# Patient Record
Sex: Female | Born: 1980 | Race: White | Hispanic: No | Marital: Married | Smoking: Former smoker
Health system: Southern US, Community
[De-identification: ages and names within clinical notes are randomized; demographics above are authoritative.]

## PROBLEM LIST (undated history)

## (undated) DIAGNOSIS — K635 Polyp of colon: Secondary | ICD-10-CM

## (undated) DIAGNOSIS — B001 Herpesviral vesicular dermatitis: Secondary | ICD-10-CM

## (undated) DIAGNOSIS — K644 Residual hemorrhoidal skin tags: Secondary | ICD-10-CM

## (undated) DIAGNOSIS — N390 Urinary tract infection, site not specified: Secondary | ICD-10-CM

## (undated) DIAGNOSIS — T7840XA Allergy, unspecified, initial encounter: Secondary | ICD-10-CM

## (undated) DIAGNOSIS — Z789 Other specified health status: Secondary | ICD-10-CM

## (undated) DIAGNOSIS — F4010 Social phobia, unspecified: Secondary | ICD-10-CM

## (undated) DIAGNOSIS — J45909 Unspecified asthma, uncomplicated: Secondary | ICD-10-CM

## (undated) DIAGNOSIS — K602 Anal fissure, unspecified: Secondary | ICD-10-CM

## (undated) DIAGNOSIS — F900 Attention-deficit hyperactivity disorder, predominantly inattentive type: Secondary | ICD-10-CM

## (undated) DIAGNOSIS — U071 COVID-19: Secondary | ICD-10-CM

## (undated) DIAGNOSIS — R002 Palpitations: Secondary | ICD-10-CM

## (undated) HISTORY — PX: CYSTOSCOPY: SUR368

## (undated) HISTORY — DX: Allergy, unspecified, initial encounter: T78.40XA

## (undated) HISTORY — DX: Unspecified asthma, uncomplicated: J45.909

## (undated) HISTORY — DX: Herpesviral vesicular dermatitis: B00.1

## (undated) HISTORY — DX: Polyp of colon: K63.5

## (undated) HISTORY — PX: WISDOM TOOTH EXTRACTION: SHX21

## (undated) HISTORY — DX: Palpitations: R00.2

## (undated) HISTORY — DX: COVID-19: U07.1

## (undated) HISTORY — DX: Anal fissure, unspecified: K60.2

## (undated) HISTORY — DX: Residual hemorrhoidal skin tags: K64.4

## (undated) HISTORY — PX: COLONOSCOPY: SHX174

---
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
## 2012-10-06 ENCOUNTER — Encounter (HOSPITAL_COMMUNITY): Payer: Self-pay | Admitting: *Deleted

## 2012-10-06 ENCOUNTER — Emergency Department (INDEPENDENT_AMBULATORY_CARE_PROVIDER_SITE_OTHER)
Admission: EM | Admit: 2012-10-06 | Discharge: 2012-10-06 | Disposition: A | Discharge status: Home / Self Care | Payer: Self-pay | Source: Home / Self Care

## 2012-10-06 DIAGNOSIS — N390 Urinary tract infection, site not specified: Secondary | ICD-10-CM

## 2012-10-06 LAB — POCT URINALYSIS DIP (DEVICE)
Bilirubin Urine: NEGATIVE
Glucose, UA: 100 mg/dL — AB
Ketones, ur: NEGATIVE mg/dL
Specific Gravity, Urine: <=1.005 (ref 1.005–1.030)

## 2012-10-06 MED ORDER — CEPHALEXIN 500 MG PO CAPS: 500.0000 mg | ORAL_CAPSULE | Freq: Two times a day (BID) | ORAL | Status: DC

## 2012-10-06 NOTE — ED Provider Notes (Signed)
Bobbiejo Ishikawa is a 32 y.o. female who presents to Urgent Care today for dysuria. Patient has noted dysuria or cloudy urine urinary frequency and foul-smelling urine since yesterday. She was traveling yesterday and took AZO which has helped.  She is a history of frequent UTIs most recently October.  In 2005 she was seen by urologist for frequent UTIs where a cystoscopy was performed to white in her urethra.  She has never had a urinary tract infection with resistant organisms.  She feels well otherwise no fevers chills abdominal or back pain.  Her current symptoms are consistent with prior UTIs.  Her most recent menstrual period was December 30th. She is attempting to become pregnant.   PMH reviewed. Frequent UTI History  Substance Use Topics  . Smoking status: Never Smoker   . Smokeless tobacco: Not on file  . Alcohol Use: Yes     Comment: occasionally   ROS as above Medications reviewed. No current facility-administered medications for this encounter.   Current Outpatient Prescriptions  Medication Sig Dispense Refill  . cephALEXin (KEFLEX) 500 MG capsule Take 1 capsule (500 mg total) by mouth 2 (two) times daily.  14 capsule  0    Exam:  BP 134/80  Pulse 57  Temp 98.8 F (37.1 C) (Oral)  Resp 16  SpO2 100%  LMP 09/30/2012 Gen: Well NAD Lungs: CTABL Nl WOB Heart: RRR no MRG Abd: NABS, NT, ND Exts: Non edematous BL  LE, warm and well perfused.   Results for orders placed during the hospital encounter of 10/06/12 (from the past 24 hour(s))  POCT URINALYSIS DIP (DEVICE)     Status: Abnormal   Collection Time   10/06/12 12:58 PM      Component Value Range   Glucose, UA 100 (*) NEGATIVE mg/dL   Bilirubin Urine NEGATIVE  NEGATIVE   Ketones, ur NEGATIVE  NEGATIVE mg/dL   Specific Gravity, Urine <=1.005  1.005 - 1.030   Hgb urine dipstick NEGATIVE  NEGATIVE   pH 5.5  5.0 - 8.0   Protein, ur 30 (*) NEGATIVE mg/dL   Urobilinogen, UA 1.0  0.0 - 1.0 mg/dL   Nitrite POSITIVE (*)  NEGATIVE   Leukocytes, UA LARGE (*) NEGATIVE   No results found.  Assessment and Plan: 32 y.o. female with UTI.  Plan to treat with Keflex.  Discussed warning signs or symptoms. Please see discharge instructions. Patient expresses understanding. F/u PRN     Rodolph Bong, MD 10/06/12 1304

## 2012-10-06 NOTE — ED Provider Notes (Signed)
Medical screening examination/treatment/procedure(s) were performed by  [facility] fellow and as supervising physician I was immediately available for consultation/collaboration.   Sharin Grave, MD   Sharin Grave, MD 10/06/12 [phone]

## 2012-10-06 NOTE — ED Notes (Signed)
Patient complains of burning sensation while voiding x 1 day. Patient states strong odor from urine. Denies nausea, vomiting, diarrhea, fever/chills.

## 2012-12-03 ENCOUNTER — Encounter (HOSPITAL_COMMUNITY): Payer: Self-pay | Admitting: Pharmacist

## 2012-12-03 ENCOUNTER — Encounter (HOSPITAL_COMMUNITY): Payer: Self-pay | Admitting: *Deleted

## 2012-12-03 NOTE — H&P (Signed)
32 yo G1 @ [redacted] wks gestation by LMP presents for surgical mngt of missed Ab   PMHx:  UTI PSHx:  Colonoscopy, cystocopy All:  macrobid Meds:  PNV FHx:  N/c SHx:  Negative tobacco  Af, VSS Gen - NAD ABd - soft, NT Ext - NT, no edema CV - RRR Lungs - clear  [place] - gestational sac c/w [redacted] wk gestation, no fetal pole or YS.  No change in [place] over 2 wks  A/P:  Missed Ab D&E

## 2012-12-05 MED ORDER — DEXTROSE 5 % IV SOLN
2.0000 g | INTRAVENOUS | Status: AC
  Filled 2012-12-05: qty 2

## 2012-12-06 ENCOUNTER — Ambulatory Visit (HOSPITAL_COMMUNITY)
Admission: RE | Admit: 2012-12-06 | Payer: Commercial Managed Care - PPO | Source: Ambulatory Visit | Admitting: Obstetrics and Gynecology

## 2012-12-06 HISTORY — DX: Urinary tract infection, site not specified: N39.0

## 2012-12-06 HISTORY — DX: Other specified health status: Z78.9

## 2012-12-06 SURGERY — DILATION AND EVACUATION, UTERUS
Anesthesia: Choice

## 2013-03-01 LAB — OB RESULTS CONSOLE GC/CHLAMYDIA
CHLAMYDIA, DNA PROBE: NEGATIVE
Gonorrhea: NEGATIVE

## 2013-04-08 LAB — OB RESULTS CONSOLE ABO/RH: RH Type: POSITIVE

## 2013-04-08 LAB — OB RESULTS CONSOLE HEPATITIS B SURFACE ANTIGEN: HEP B S AG: NEGATIVE

## 2013-04-08 LAB — OB RESULTS CONSOLE RUBELLA ANTIBODY, IGM: RUBELLA: IMMUNE

## 2013-04-08 LAB — OB RESULTS CONSOLE ANTIBODY SCREEN: ANTIBODY SCREEN: NEGATIVE

## 2013-04-08 LAB — OB RESULTS CONSOLE RPR: RPR: NONREACTIVE

## 2013-04-08 LAB — OB RESULTS CONSOLE HIV ANTIBODY (ROUTINE TESTING): HIV: NONREACTIVE

## 2013-05-27 ENCOUNTER — Encounter: Payer: Self-pay | Admitting: Internal Medicine

## 2013-05-27 ENCOUNTER — Ambulatory Visit (INDEPENDENT_AMBULATORY_CARE_PROVIDER_SITE_OTHER): Payer: Commercial Managed Care - PPO | Admitting: Internal Medicine

## 2013-05-27 VITALS — BP 100/52 | Ht 62.0 in | Wt 156.7 lb

## 2013-05-27 DIAGNOSIS — Z3490 Encounter for supervision of normal pregnancy, unspecified, unspecified trimester: Secondary | ICD-10-CM | POA: Insufficient documentation

## 2013-05-27 DIAGNOSIS — R002 Palpitations: Secondary | ICD-10-CM

## 2013-05-27 DIAGNOSIS — Z331 Pregnant state, incidental: Secondary | ICD-10-CM

## 2013-05-27 NOTE — Patient Instructions (Addendum)
Your physician recommends that you schedule a follow-up appointment as needed  

## 2013-05-27 NOTE — Progress Notes (Signed)
OFFICE NOTE  Chief Complaint:  Palpitations  Primary Care Physician: No PCP Per Patient  HPI:  Judy Shaw is a pleasant 32 year old female who is now [redacted] weeks pregnant. She has a history of palpitations dating back to age 20 and a family history of palpitations. She was also told that this is fairly normal and recently reported to her husband that she had some palpitations. He said to her that he does not have any palpitations and felt that it was clearly abnormal for her. Therefore she brought it up to her OB/GYN, who recommended evaluation in our office. She reports a palpitations, at random times. She does not feel like her heart races but just simply skips beats. In fact her symptoms were pretty bad initially the pregnancy, but have progressively gotten somewhat better. Now she reports that she is fairly unaware of them. She denies any chest pain, worsening shortness of breath, presyncope or syncopal episodes.  PMHx:  Past Medical History  Diagnosis Date  . Medical history non-contributory   . UTI (urinary tract infection)     history - 7 last year in 2013 per patient    Past Surgical History  Procedure Laterality Date  . Colonoscopy    . Cystoscopy    . Wisdom tooth extraction      FAMHx:  Family History  Problem Relation Age of Onset  . Heart Problems Father     palpitations  . Heart Problems Brother     palpitations  . Hypertension      SOCHx:   reports that she quit smoking about 9 years ago. She has never used smokeless tobacco. She reports that she does not drink alcohol or use illicit drugs.  ALLERGIES:  Allergies  Allergen Reactions  . Macrobid [Nitrofurantoin Macrocrystal] Anaphylaxis    ROS: A comprehensive review of systems was negative except for: Cardiovascular: positive for palpitations  HOME MEDS: Current Outpatient Prescriptions  Medication Sig Dispense Refill  . folic acid (FOLVITE) 400 MCG tablet Take 400 mcg by mouth daily.      .  Prenatal Vit-Fe Fumarate-FA (MULTIVITAMIN-PRENATAL) 27-0.8 MG TABS Take 1 tablet by mouth daily at 12 noon.       No current facility-administered medications for this visit.    LABS/IMAGING: No results found for this or any previous visit (from the past 48 hour(s)). No results found.  VITALS: BP 100/52  Ht 5\' 2"  (1.575 m)  Wt 156 lb 11.2 oz (71.079 kg)  BMI 28.65 kg/m2  LMP 09/30/2012  EXAM: General appearance: alert and no distress Neck: no adenopathy, no carotid bruit, no JVD, supple, symmetrical, trachea midline and thyroid not enlarged, symmetric, no tenderness/mass/nodules Lungs: clear to auscultation bilaterally Heart: regular rate and rhythm, S1, S2 normal, no murmur, click, rub or gallop Abdomen: gravid uterus Extremities: extremities normal, atraumatic, no cyanosis or edema Pulses: 2+ and symmetric Skin: Skin color, texture, turgor normal. No rashes or lesions Neurologic: Grossly normal  EKG: Normal sinus rhythm at 57  ASSESSMENT: 1. Benign palpitations 2. Pregnancy  PLAN: 1.   Mrs. Knies has had palpitations for years which she reported were slightly worse after conception, but have waned somewhat in the past few weeks. She denies any high-risk features such as syncope, worsening shortness of breath, chest pain and has no other cardiac risk factors. This is her first pregnancy. She seems to be tolerating the palpitations better anemia decreased in frequency. There're no evidence of extrasystoles on her EKG today. There is no evidence  for accessory bypass tract or preexcitation on her EKG. She has no appreciable heart murmur. We discussed options today including possibly wearing a monitor. Although her symptoms are improving, therefore I would not recommend one at this time. If the symptoms worsen we could consider wearing a monitor to identify the cause of her palpitations. Generally at like to try to avoid beta blockers if possible during pregnancy and I shared this  with her. Finally I asked her to work on trying to eliminate caffeine from her diet if possible which may be very helpful for her. I would be happy to see her back as needed.  Thanks for the kind referral. If I can be further assistance with her other patients, please don't hesitate to contact me.  Chrystie Nose, MD, [facility] Attending Cardiologist [facility]  Francisca Langenderfer C 05/27/2013, 4:53 PM

## 2013-07-15 ENCOUNTER — Ambulatory Visit: Payer: Commercial Managed Care - PPO | Admitting: Cardiology

## 2013-10-02 NOTE — L&D Delivery Note (Signed)
Operative Delivery Note At 1:26 PM a viable female was delivered via .  Presentation: vertex; Position: Right,, Occiput,, Anterior; Station: +3.  Verbal consent: obtained from patient.  Risks and benefits discussed in detail.  Risks include, but are not limited to the risks of anesthesia, bleeding, infection, damage to maternal tissues, fetal cephalhematoma.  There is also the risk of inability to effect vaginal delivery of the head, or shoulder dystocia that cannot be resolved by established maneuvers, leading to the need for emergency cesarean section.  APGAR: pending   Placenta status manually removed intact  , .   Cord:  with the following complications: long and nuchal cord x 1.  Cord pH: not done  Anesthesia: Spinal  Instruments: Mushroom 4 pulls with 2 popoffs Episiotomy: mediolateral  Lacerations: none Suture Repair: 3.0 chromic Est. Blood Loss (mL): 300  Mom to postpartum.  Baby to Couplet care / Skin to Skin.  Chanteria Haggard L 10/31/2013, 1:44 PM

## 2013-10-06 LAB — OB RESULTS CONSOLE GBS: GBS: NEGATIVE

## 2013-10-31 ENCOUNTER — Inpatient Hospital Stay (HOSPITAL_COMMUNITY)
Admission: AD | Admit: 2013-10-31 | Discharge: 2013-11-02 | DRG: 774 | Disposition: A | Discharge status: Home / Self Care | Payer: Commercial Managed Care - PPO | Source: Ambulatory Visit | Attending: Obstetrics and Gynecology | Admitting: Obstetrics and Gynecology

## 2013-10-31 ENCOUNTER — Encounter (HOSPITAL_COMMUNITY): Payer: Self-pay

## 2013-10-31 ENCOUNTER — Inpatient Hospital Stay (HOSPITAL_COMMUNITY): Payer: Commercial Managed Care - PPO | Admitting: Anesthesiology

## 2013-10-31 ENCOUNTER — Encounter (HOSPITAL_COMMUNITY): Payer: Commercial Managed Care - PPO | Admitting: Anesthesiology

## 2013-10-31 DIAGNOSIS — O4290 Premature rupture of membranes, unspecified as to length of time between rupture and onset of labor, unspecified weeks of gestation: Principal | ICD-10-CM | POA: Diagnosis present

## 2013-10-31 DIAGNOSIS — Z3490 Encounter for supervision of normal pregnancy, unspecified, unspecified trimester: Secondary | ICD-10-CM

## 2013-10-31 DIAGNOSIS — IMO0001 Reserved for inherently not codable concepts without codable children: Secondary | ICD-10-CM

## 2013-10-31 DIAGNOSIS — R002 Palpitations: Secondary | ICD-10-CM

## 2013-10-31 LAB — CBC
HCT: 37.8 % (ref 36.0–46.0)
HEMOGLOBIN: 13.1 g/dL (ref 12.0–15.0)
MCH: 32.6 pg (ref 26.0–34.0)
MCHC: 34.7 g/dL (ref 30.0–36.0)
MCV: 94 fL (ref 78.0–100.0)
Platelets: 157 10*3/uL (ref 150–400)
RBC: 4.02 MIL/uL (ref 3.87–5.11)
RDW: 12.8 % (ref 11.5–15.5)
WBC: 12 10*3/uL — ABNORMAL HIGH (ref 4.0–10.5)

## 2013-10-31 LAB — RPR: RPR Ser Ql: NONREACTIVE

## 2013-10-31 LAB — POCT FERN TEST: POCT Fern Test: POSITIVE

## 2013-10-31 MED ORDER — LACTATED RINGERS IV SOLN
500.0000 mL | INTRAVENOUS | Status: DC | PRN
  Administered 2013-10-31: 1000 mL via INTRAVENOUS
  Administered 2013-10-31: 500 mL via INTRAVENOUS

## 2013-10-31 MED ORDER — PRENATAL MULTIVITAMIN CH
1.0000 | ORAL_TABLET | Freq: Every day | ORAL | Status: DC
  Administered 2013-11-01 – 2013-11-02 (×2): 1 via ORAL
  Filled 2013-10-31 (×2): qty 1

## 2013-10-31 MED ORDER — MEDROXYPROGESTERONE ACETATE 150 MG/ML IM SUSP: 150.0000 mg | INTRAMUSCULAR | Status: DC | PRN

## 2013-10-31 MED ORDER — SODIUM BICARBONATE 8.4 % IV SOLN
INTRAVENOUS | Status: DC | PRN
  Administered 2013-10-31: 5 mL via EPIDURAL

## 2013-10-31 MED ORDER — LACTATED RINGERS IV SOLN
INTRAVENOUS | Status: DC
Start: 2013-10-31 — End: 2013-10-31
  Administered 2013-10-31 (×2): via INTRAVENOUS

## 2013-10-31 MED ORDER — PHENYLEPHRINE 40 MCG/ML (10ML) SYRINGE FOR IV PUSH (FOR BLOOD PRESSURE SUPPORT)
80.0000 ug | PREFILLED_SYRINGE | INTRAVENOUS | Status: DC | PRN
  Filled 2013-10-31: qty 2

## 2013-10-31 MED ORDER — MEASLES, MUMPS & RUBELLA VAC ~~LOC~~ INJ: 0.5000 mL | INJECTION | Freq: Once | SUBCUTANEOUS | Status: DC

## 2013-10-31 MED ORDER — ZOLPIDEM TARTRATE 5 MG PO TABS: 5.0000 mg | ORAL_TABLET | Freq: Every evening | ORAL | Status: DC | PRN

## 2013-10-31 MED ORDER — EPHEDRINE 5 MG/ML INJ
10.0000 mg | INTRAVENOUS | Status: DC | PRN
  Filled 2013-10-31: qty 4
  Filled 2013-10-31: qty 2

## 2013-10-31 MED ORDER — IBUPROFEN 600 MG PO TABS: 600.0000 mg | ORAL_TABLET | Freq: Four times a day (QID) | ORAL | Status: DC | PRN

## 2013-10-31 MED ORDER — DIPHENHYDRAMINE HCL 25 MG PO CAPS: 25.0000 mg | ORAL_CAPSULE | Freq: Four times a day (QID) | ORAL | Status: DC | PRN

## 2013-10-31 MED ORDER — CITRIC ACID-SODIUM CITRATE 334-500 MG/5ML PO SOLN: 30.0000 mL | ORAL | Status: DC | PRN

## 2013-10-31 MED ORDER — BENZOCAINE-MENTHOL 20-0.5 % EX AERO
1.0000 "application " | INHALATION_SPRAY | CUTANEOUS | Status: DC | PRN
  Administered 2013-10-31: 1 via TOPICAL
  Filled 2013-10-31: qty 56

## 2013-10-31 MED ORDER — OXYTOCIN 40 UNITS IN LACTATED RINGERS INFUSION - SIMPLE MED
62.5000 mL/h | INTRAVENOUS | Status: DC
  Filled 2013-10-31: qty 1000

## 2013-10-31 MED ORDER — IBUPROFEN 600 MG PO TABS
600.0000 mg | ORAL_TABLET | Freq: Four times a day (QID) | ORAL | Status: DC
  Administered 2013-10-31 – 2013-11-02 (×8): 600 mg via ORAL
  Filled 2013-10-31 (×8): qty 1

## 2013-10-31 MED ORDER — FLEET ENEMA 7-19 GM/118ML RE ENEM
1.0000 | ENEMA | Freq: Every day | RECTAL | Status: DC | PRN
Start: 2013-10-31 — End: 2013-11-02

## 2013-10-31 MED ORDER — ONDANSETRON HCL 4 MG/2ML IJ SOLN: 4.0000 mg | Freq: Four times a day (QID) | INTRAMUSCULAR | Status: DC | PRN

## 2013-10-31 MED ORDER — SENNOSIDES-DOCUSATE SODIUM 8.6-50 MG PO TABS
2.0000 | ORAL_TABLET | ORAL | Status: DC
  Administered 2013-10-31 – 2013-11-01 (×2): 2 via ORAL
  Filled 2013-10-31 (×2): qty 2

## 2013-10-31 MED ORDER — OXYCODONE-ACETAMINOPHEN 5-325 MG PO TABS: 1.0000 | ORAL_TABLET | ORAL | Status: DC | PRN

## 2013-10-31 MED ORDER — DIBUCAINE 1 % RE OINT: 1.0000 "application " | TOPICAL_OINTMENT | RECTAL | Status: DC | PRN

## 2013-10-31 MED ORDER — EPHEDRINE 5 MG/ML INJ
10.0000 mg | INTRAVENOUS | Status: DC | PRN
  Filled 2013-10-31: qty 2

## 2013-10-31 MED ORDER — WITCH HAZEL-GLYCERIN EX PADS: 1.0000 "application " | MEDICATED_PAD | CUTANEOUS | Status: DC | PRN

## 2013-10-31 MED ORDER — ACETAMINOPHEN 325 MG PO TABS: 650.0000 mg | ORAL_TABLET | ORAL | Status: DC | PRN

## 2013-10-31 MED ORDER — LIDOCAINE HCL (PF) 1 % IJ SOLN
30.0000 mL | INTRAMUSCULAR | Status: DC | PRN
  Filled 2013-10-31: qty 30

## 2013-10-31 MED ORDER — ONDANSETRON HCL 4 MG PO TABS: 4.0000 mg | ORAL_TABLET | ORAL | Status: DC | PRN

## 2013-10-31 MED ORDER — SIMETHICONE 80 MG PO CHEW: 80.0000 mg | CHEWABLE_TABLET | ORAL | Status: DC | PRN

## 2013-10-31 MED ORDER — FLEET ENEMA 7-19 GM/118ML RE ENEM: 1.0000 | ENEMA | RECTAL | Status: DC | PRN

## 2013-10-31 MED ORDER — PHENYLEPHRINE 40 MCG/ML (10ML) SYRINGE FOR IV PUSH (FOR BLOOD PRESSURE SUPPORT)
80.0000 ug | PREFILLED_SYRINGE | INTRAVENOUS | Status: DC | PRN
  Filled 2013-10-31: qty 2
  Filled 2013-10-31: qty 10

## 2013-10-31 MED ORDER — OXYTOCIN 40 UNITS IN LACTATED RINGERS INFUSION - SIMPLE MED
1.0000 m[IU]/min | INTRAVENOUS | Status: DC
  Administered 2013-10-31: 1.333 m[IU]/min via INTRAVENOUS

## 2013-10-31 MED ORDER — DIPHENHYDRAMINE HCL 50 MG/ML IJ SOLN: 12.5000 mg | INTRAMUSCULAR | Status: DC | PRN

## 2013-10-31 MED ORDER — LACTATED RINGERS IV SOLN
500.0000 mL | Freq: Once | INTRAVENOUS | Status: AC
  Administered 2013-10-31: 500 mL via INTRAVENOUS

## 2013-10-31 MED ORDER — OXYTOCIN BOLUS FROM INFUSION
500.0000 mL | INTRAVENOUS | Status: DC
  Administered 2013-10-31: 500 mL via INTRAVENOUS

## 2013-10-31 MED ORDER — TERBUTALINE SULFATE 1 MG/ML IJ SOLN: 0.2500 mg | Freq: Once | INTRAMUSCULAR | Status: DC | PRN

## 2013-10-31 MED ORDER — LANOLIN HYDROUS EX OINT: TOPICAL_OINTMENT | CUTANEOUS | Status: DC | PRN

## 2013-10-31 MED ORDER — BISACODYL 10 MG RE SUPP: 10.0000 mg | Freq: Every day | RECTAL | Status: DC | PRN

## 2013-10-31 MED ORDER — TETANUS-DIPHTH-ACELL PERTUSSIS 5-2.5-18.5 LF-MCG/0.5 IM SUSP
0.5000 mL | Freq: Once | INTRAMUSCULAR | Status: DC
Start: 2013-11-01 — End: 2013-11-02

## 2013-10-31 MED ORDER — ONDANSETRON HCL 4 MG/2ML IJ SOLN: 4.0000 mg | INTRAMUSCULAR | Status: DC | PRN

## 2013-10-31 MED ORDER — FENTANYL 2.5 MCG/ML BUPIVACAINE 1/10 % EPIDURAL INFUSION (WH - ANES)
14.0000 mL/h | INTRAMUSCULAR | Status: DC | PRN
  Administered 2013-10-31: 14 mL/h via EPIDURAL
  Filled 2013-10-31: qty 125

## 2013-10-31 NOTE — Anesthesia Preprocedure Evaluation (Signed)

## 2013-10-31 NOTE — MAU Note (Addendum)
Pt states LOF since 0130AM that has been clear, states she has had an uncomplicated pregnancy and thinks she is having occ,. uc's Denies vaginal discharge or bleeding

## 2013-10-31 NOTE — Anesthesia Procedure Notes (Signed)

## 2013-10-31 NOTE — Progress Notes (Signed)
Awaiting Birthing suites

## 2013-10-31 NOTE — Lactation Note (Signed)
This note was copied from the chart of Judy Shaw. Lactation Consultation Note Initial visit at 3 hours of age.  Mom reports breast fed once and trying again because baby started to cue.  Baby skin to skin in cross cradle,  Baby licking and mom is able to demonstrate hand expression with visible colostrum.  Minimal assist to latch baby with wide flanged lips.  Observed greater than 10 minutes of good rhythmic suckling and few swallows heard.  [facility] LC resources given and discussed.  Baby and me booklet discussed regarding feeding frequency and output.  Mom denies pain or problems.  Discussed baby's small size (4#11oz) and encouraged mom to wake baby after 3 hours if baby is sleepy and not showing feeding cues.  Encouraged skin to skin and to call MBU RN for assist is any problems or missed feedings.  Discussed plan to spoon feed and pump as needed.    Patient Name: Judy Judy Shaw'UToday's Date: 10/31/2013 Reason for consult: Initial assessment   Maternal Data Formula Feeding for Exclusion: No Has patient been taught Hand Expression?: Yes Does the patient have breastfeeding experience prior to this delivery?: No  Feeding Feeding Type: Breast Fed Length of feed:  (observed greaster than 10 minuts)  LATCH Score/Interventions Latch: Grasps breast easily, tongue down, lips flanged, rhythmical sucking. Intervention(s): Assist with latch;Breast compression  Audible Swallowing: A few with stimulation Intervention(s): Skin to skin;Hand expression  Type of Nipple: Everted at rest and after stimulation (semi flat with compression baby latches well)  Comfort (Breast/Nipple): Soft / non-tender     Hold (Positioning): No assistance needed to correctly position infant at breast.  LATCH Score: 9  Lactation Tools Discussed/Used     Consult Status Consult Status: Follow-up Date: 11/01/13 Follow-up type: In-patient    Kiyo Heal, Arvella MerlesJana Lynn 10/31/2013, 5:02 PM

## 2013-10-31 NOTE — Progress Notes (Signed)
To room 175

## 2013-10-31 NOTE — H&P (Signed)
Judy Shaw J Shaw is a 33 y.o. female presenting for SROM about 1:30am. Now UCs getting stronger. No HA, no vision change, no epigastric pain. Maternal Medical History:  Reason for admission: Rupture of membranes.   Fetal activity: Perceived fetal activity is normal.      OB History   Grav Para Term Preterm Abortions TAB SAB Ect Mult Living   2 0 0 0 1 0 1 0 0 0      Past Medical History  Diagnosis Date  . Medical history non-contributory   . UTI (urinary tract infection)     history - 7 last year in 2013 per patient   Past Surgical History  Procedure Laterality Date  . Colonoscopy    . Cystoscopy    . Wisdom tooth extraction     Family History: family history includes Heart Problems in her brother and father; Hypertension in an other family member. Social History:  reports that she quit smoking about 10 years ago. She has never used smokeless tobacco. She reports that she does not drink alcohol or use illicit drugs.   Prenatal Transfer Tool  Maternal Diabetes: No Genetic Screening: Normal Maternal Ultrasounds/Referrals: Normal Fetal Ultrasounds or other Referrals:  None Maternal Substance Abuse:  No Significant Maternal Medications:  None Significant Maternal Lab Results:  None Other Comments:  None  Review of Systems  Eyes: Negative for blurred vision.  Gastrointestinal: Negative for abdominal pain.  Neurological: Negative for headaches.    Dilation: 1.5 (fern collected) Effacement (%): 70 Station: -3 Exam by:: D Nelson RN Blood pressure 129/73, pulse 53, temperature 98.6 F (37 C), temperature source Oral, resp. rate 20, height 5' 2.75" (1.594 m), weight 173 lb 2 oz (78.529 kg). Maternal Exam:  Uterine Assessment: Contraction strength is moderate.  Contraction frequency is regular.   Abdomen: Fetal presentation: vertex     Fetal Exam Fetal State Assessment: Category I - tracings are normal.     Physical Exam  Cardiovascular: Normal rate and regular  rhythm.   Respiratory: Effort normal.  GI: Soft. There is no tenderness.  Neurological: She has normal reflexes.  Vertex to palpation  Prenatal labs: ABO, Rh: A/Positive/-- (07/08 0000) Antibody: Negative (07/08 0000) Rubella: Immune (07/08 0000) RPR: Nonreactive (07/08 0000)  HBsAg: Negative (07/08 0000)  HIV: Non-reactive (07/08 0000)  GBS: Negative (01/05 0000)   Assessment/Plan: 33 yo G2P0 at 39 0/7 weeks with PROM. Now UCs getting stronger. Recheck cervix 1-2 hours D/W patient   [name],[name] 10/31/2013, 6:17 AM

## 2013-10-31 NOTE — Progress Notes (Signed)
Patient feels more contractions FHR Category 1 Toco UCs every 6 min Cervix 90% 2 to 3 -1 Vertex Augment with pitocin epidural

## 2013-11-01 LAB — CBC
HCT: 33.5 % — ABNORMAL LOW (ref 36.0–46.0)
HEMOGLOBIN: 11.2 g/dL — AB (ref 12.0–15.0)
MCH: 32.2 pg (ref 26.0–34.0)
MCHC: 33.4 g/dL (ref 30.0–36.0)
MCV: 96.3 fL (ref 78.0–100.0)
Platelets: 122 10*3/uL — ABNORMAL LOW (ref 150–400)
RBC: 3.48 MIL/uL — ABNORMAL LOW (ref 3.87–5.11)
RDW: 13.2 % (ref 11.5–15.5)
WBC: 13.9 10*3/uL — AB (ref 4.0–10.5)

## 2013-11-01 NOTE — Lactation Note (Signed)
This note was copied from the chart of Judy Vickii PennaJennifer Popov. Lactation Consultation Note Follow up consult:  [name] 22 hours old and sleeping on mother's chest.  Mother's nipples are pink and sore.  Provided comfort gels and reviewed care.  Mother states breastfeeding going well.  Encouraged mother to call for assistance with next feeding.    Patient Name: Judy Shaw Judy Shaw's Date: 11/01/2013 Reason for consult: Follow-up assessment   Maternal Data    Feeding Feeding Type: Breast Fed  LATCH Score/Interventions Latch: Repeated attempts needed to sustain latch, nipple held in mouth throughout feeding, stimulation needed to elicit sucking reflex.  Audible Swallowing: A few with stimulation  Type of Nipple: Everted at rest and after stimulation  Comfort (Breast/Nipple): Soft / non-tender     Hold (Positioning): Assistance needed to correctly position infant at breast and maintain latch.  LATCH Score: 7  Lactation Tools Discussed/Used     Consult Status Date: 11/02/13 Follow-up type: In-patient    Dahlia ByesBerkelhammer, [facility]os[facility] 11/01/2013, 11:51 AM

## 2013-11-01 NOTE — Anesthesia Postprocedure Evaluation (Signed)
  Anesthesia Post-op Note  Patient: Judy Shaw  Procedure(s) Performed: * No procedures listed *  Patient Location: Mother/Baby  Anesthesia Type:Epidural  Level of Consciousness: awake and alert   Airway and Oxygen Therapy: Patient Spontanous Breathing  Post-op Pain: none  Post-op Assessment: Patient's Cardiovascular Status Stable, Respiratory Function Stable, Patent Airway, No signs of Nausea or vomiting, Adequate PO intake, Pain level controlled, No headache, No backache, No residual numbness and No residual motor weakness  Post-op Vital Signs: Reviewed and stable  Complications: No apparent anesthesia complications

## 2013-11-01 NOTE — Progress Notes (Signed)
Post Partum Day 1 Subjective: no complaints, up ad lib, voiding and tolerating PO  Objective: Blood pressure 99/48, pulse 70, temperature 98.1 F (36.7 C), temperature source Oral, resp. rate 20, height 5' 2.75" (1.594 m), weight 78.529 kg (173 lb 2 oz), SpO2 98.00%, unknown if currently breastfeeding.  Physical Exam:  General: alert, cooperative and appears stated age Lochia: appropriate Uterine Fundus: firm Incision: healing well DVT Evaluation: No evidence of DVT seen on physical exam.   Recent Labs  10/31/13 0520 11/01/13 0614  HGB 13.1 11.2*  HCT 37.8 33.5*    Assessment/Plan: Plan for discharge tomorrow and Breastfeeding   LOS: 1 day   Judy Shaw L 11/01/2013, 7:57 AM

## 2013-11-02 LAB — CBC
HCT: 33.5 % — ABNORMAL LOW (ref 36.0–46.0)
Hemoglobin: 11.2 g/dL — ABNORMAL LOW (ref 12.0–15.0)
MCH: 32.3 pg (ref 26.0–34.0)
MCHC: 33.4 g/dL (ref 30.0–36.0)
MCV: 96.5 fL (ref 78.0–100.0)
Platelets: 126 10*3/uL — ABNORMAL LOW (ref 150–400)
RBC: 3.47 MIL/uL — AB (ref 3.87–5.11)
RDW: 13.3 % (ref 11.5–15.5)
WBC: 10.6 10*3/uL — AB (ref 4.0–10.5)

## 2013-11-02 MED ORDER — IBUPROFEN 600 MG PO TABS: 600.0000 mg | ORAL_TABLET | Freq: Four times a day (QID) | ORAL | Status: DC

## 2013-11-02 NOTE — Discharge Summary (Signed)
Obstetric Discharge Summary Reason for Admission: onset of labor Prenatal Procedures: none Intrapartum Procedures: vacuum Postpartum Procedures: none Complications-Operative and Postpartum: 2nd degree perineal laceration Hemoglobin  Date Value Range Status  11/02/2013 11.2* 12.0 - 15.0 g/dL Final     HCT  Date Value Range Status  11/02/2013 33.5* 36.0 - 46.0 % Final    Physical Exam:  General: alert, cooperative and appears stated age 62Lochia: appropriate Uterine Fundus: firm Incision: healing well, no significant drainage, no dehiscence DVT Evaluation: No evidence of DVT seen on physical exam.  Discharge Diagnoses: Term Pregnancy-delivered  Discharge Information: Date: 11/02/2013 Activity: pelvic rest Diet: routine Medications: Ibuprofen Condition: improved Instructions: refer to practice specific booklet Discharge to: home   Newborn Data: Live born female  Birth Weight: 4 lb 11.8 oz (2149 g) APGAR: 7, 9  Home with mother.  Judy Shaw L 11/02/2013, 7:07 AM

## 2013-11-02 NOTE — Progress Notes (Signed)
Post Partum Day 2 Subjective: no complaints  Objective: Blood pressure 111/60, pulse 52, temperature 98.4 F (36.9 C), temperature source Oral, resp. rate 18, height 5' 2.75" (1.594 m), weight 78.529 kg (173 lb 2 oz), SpO2 98.00%, unknown if currently breastfeeding.  Physical Exam:  General: alert, cooperative and appears stated age Lochia: appropriate Uterine Fundus: firm Incision: healing well DVT Evaluation: No evidence of DVT seen on physical exam.   Recent Labs  11/01/13 0614 11/02/13 0550  HGB 11.2* 11.2*  HCT 33.5* 33.5*    Assessment/Plan: Discharge home and Breastfeeding   LOS: 2 days   Judy Shaw L 11/02/2013, 7:06 AM

## 2013-11-02 NOTE — Lactation Note (Signed)
This note was copied from the chart of Judy Vickii PennaJennifer Aaronson. Lactation Consultation Note Follow up consult:  Baby 44 hours old.  4lb 9 oz.  LS 8.  Breastfeeding going well, mother has some nipple soreness, assisted mother with a deeper wider latch in football hold.  Mother using comfort gels.  Mother hand expressed and has a good flow of colostrum, breasts starting to fill.  Reviewed engorgement care, supply and demand, lactation support services. Recommend post pumping once or twice a day and giving whatever is expressed back to baby.  Mother does not want to use a bottle so gave mother syringe and foley cup and gave instruction.  Encouraged mother to call for further assistance.   Patient Name: Judy Shaw ZOXWR'UToday's Date: 11/02/2013 Reason for consult: Follow-up assessment   Maternal Data    Feeding Feeding Type: Breast Fed Length of feed: 30 min  LATCH Score/Interventions Latch: Grasps breast easily, tongue down, lips flanged, rhythmical sucking. Intervention(s): Adjust position  Audible Swallowing: Spontaneous and intermittent Intervention(s): Skin to skin  Type of Nipple: Everted at rest and after stimulation  Comfort (Breast/Nipple): Filling, red/small blisters or bruises, mild/mod discomfort  Problem noted: Mild/Moderate discomfort  Hold (Positioning): Assistance needed to correctly position infant at breast and maintain latch. Intervention(s): Position options;Support Pillows  LATCH Score: 8  Lactation Tools Discussed/Used Tools: Feeding cup;Comfort gels   Consult Status Consult Status: PRN    [name], [name] 11/02/2013, 9:57 AM

## 2014-01-02 ENCOUNTER — Ambulatory Visit (HOSPITAL_COMMUNITY)
Admission: RE | Admit: 2014-01-02 | Discharge: 2014-01-02 | Disposition: A | Discharge status: Home / Self Care | Payer: Commercial Managed Care - PPO | Source: Ambulatory Visit | Attending: Obstetrics and Gynecology | Admitting: Obstetrics and Gynecology

## 2014-01-02 NOTE — Lactation Note (Signed)
Infant Lactation Consultation Outpatient Visit Note  Patient Name: Judy Shaw Date of Birth: 02-11-1981 Birth Weight:  4-11  Gestational Age at Delivery: 39 Judy Shaw Type of Delivery:   Breastfeeding History Frequency of Breastfeeding: q 2 1/2 -4 hours at night Length of Feeding: 10- 15 min Voids: QS- had one while here for appointment Stools: q every other day  Supplementing / Method: Pumping:  Type of Pump: Medela   Frequency:  Volume:  2-3 oz  Comments:    Consultation Evaluation: Mom here today to evaluate ? tongue tie.  Initial Feeding Assessment: Pre-feed Weight: 9- 2.2  4146g Post-feed Weight: 9- 5.2 4230g Amount Transferred: 84 cc's Comments: Judy Shaw latched well and nurses with lots of swallows for about 5 minutes. After that time, she pulls on and off the breast and makes some clicking sounds like she is losing suction on the breast. Upper lip tip noted by mom. Tongue easily able to extend over jaw. Mom reports that her nipple is a little sore. It looks slightly pinched when baby comes off the breast but she reports no pain while Judy Shaw is nursing. Plans to ask Ped about lip tie next week at appointment. Mom reports family history of lip tie. Mom going back to work in 2 Judy Shaw and now baby is refusing the bottle. Judy Shaw took a bottle occasionally in the first few Judy Shaw of life but for the last 2 1/2 Judy Shaw she will not take one. Dad attempted to bottle feed baby while here but she would not take it. Mom walked out of room. Mom has tried several different bottles and nipples but she will not take it. I attempted to feed her but she was getting very fussy and would not take it.  She may be full from this feeding. Encouraged to continue trying at different times of the day when mom is not in the room with her. Grandmother to try too. No further questions at present. To call prn   Total Breast milk Transferred this Visit: 84 Total Supplement Given: 0  Additional  Interventions:   Follow-Up  With Ped BFSG as desired.     Judy Shaw, Judy Shaw D 01/02/2014, 9:56 AM

## 2014-08-03 ENCOUNTER — Encounter (HOSPITAL_COMMUNITY): Payer: Self-pay

## 2016-04-24 ENCOUNTER — Ambulatory Visit (INDEPENDENT_AMBULATORY_CARE_PROVIDER_SITE_OTHER): Payer: Commercial Managed Care - PPO | Admitting: Medical

## 2016-04-24 ENCOUNTER — Encounter: Payer: Self-pay | Admitting: Medical

## 2016-04-24 VITALS — BP 104/64 | HR 61 | Wt 151.0 lb

## 2016-04-24 DIAGNOSIS — R062 Wheezing: Secondary | ICD-10-CM | POA: Diagnosis not present

## 2016-04-24 DIAGNOSIS — R059 Cough, unspecified: Secondary | ICD-10-CM

## 2016-04-24 DIAGNOSIS — Z8709 Personal history of other diseases of the respiratory system: Secondary | ICD-10-CM | POA: Diagnosis not present

## 2016-04-24 DIAGNOSIS — R05 Cough: Secondary | ICD-10-CM

## 2016-04-24 MED ORDER — ALBUTEROL SULFATE HFA 108 (90 BASE) MCG/ACT IN AERS: 2.0000 | INHALATION_SPRAY | Freq: Four times a day (QID) | RESPIRATORY_TRACT | 0 refills | Status: DC | PRN

## 2016-04-24 NOTE — Progress Notes (Signed)
Subjective:     Patient ID: Judy Shaw, female   DOB: [birth year], 35 y.o.   MRN: [id number]  HPI Chief Complaint  Patient presents with  . Cough    and wheezing per pt. started 5 months ago. is not constant. has woken up to the rattling in her chest. can still work out and work outside. it just happens for no reason   Here as a new patient.   No recent PCP.  Does see [facility], Dr. Chestine Spore.  Former smoker, smoked less than 10 years, quit in early college years,  about 13 years ago.    Grew up around tobacco smoke,  Uncle and aunt with COPD.    Here for cough, wheezing.  She notes in the last 5 weeks, having lots of cough, goes into wheezing, non productive.   Gets rattly cough.  Wheezing can be without cough too.  It can wake her out of sleep.   Hasn't had cold, fever, sickness.   Still going outside, working, jogging.  Not getting SOB or cough with exercise.  Does have 1 prior hospitalization for asthmatic bronchitis many years ago, but no ongoing problems with asthma. Has cats at home.   Works as IT sales professional at [employer].     No hx/o DVT/PE.  Has went to [place] in 2nd week of June.  No calve pain.  No recent surgery.   Does have some bruising of upper arms from ropes course at [facility].   Saw cardiology in prior pregnancy with palpations, but no other problems, no diagnosis of heart disease.    No family hx/o clotting disorder.    Review of Systems     Objective:   Physical Exam  BP 104/64   Pulse 61   Wt 151 lb (68.5 kg)   LMP 04/10/2016   SpO2 99%   Breastfeeding? No   BMI 26.96 kg/m   General appearance: alert, no distress, WD/WN, white female HEENT: normocephalic, sclerae anicteric, TMs pearly, nares patent, no discharge or erythema, pharynx normal Oral cavity: MMM, no lesions Neck: supple, no lymphadenopathy, no thyromegaly, no masses, no bruits Heart: RRR, normal S1, S2, no murmurs Lungs: CTA bilaterally, no wheezes, rhonchi, or rales Pulses: 2+  symmetric, upper and lower extremities, normal cap refill Ext: no edema No calve pain or asymmetry  PFT done today and reviewed, normal spirometry.     Assessment:     Encounter Diagnoses  Name Primary?  . Wheezing Yes  . Cough   . History of asthma        Plan:     Discussed symptoms, concerns, discuss long differential for cough and wheezing.   It has been unusually hot here in [place] with highs in the 90s all last week and heat index over 100 last week.   I suspect her symptom represent mild asthma flare.   She has some risk factors for DVT with recent travel OCP use and trauma to arms but no tachycardia, normal vitals, and seems low risk for DVT/PE.    Begin albuterol inhaler trial.  Discussed proper use.   Discussed labs, CXR, but she declines today.  She plans to return soon for physical, fasting labs.    If not improving in the next 4-5 days, then call or return.  advised she call back by Friday for symptoms update.

## 2016-04-26 ENCOUNTER — Ambulatory Visit: Payer: Commercial Managed Care - PPO | Admitting: Family Medicine

## 2016-04-26 ENCOUNTER — Encounter: Payer: Self-pay | Admitting: Family Medicine

## 2016-04-26 ENCOUNTER — Ambulatory Visit (INDEPENDENT_AMBULATORY_CARE_PROVIDER_SITE_OTHER): Payer: Commercial Managed Care - PPO | Admitting: Family Medicine

## 2016-04-26 VITALS — BP 138/80 | HR 60 | Ht 63.0 in | Wt 148.6 lb

## 2016-04-26 DIAGNOSIS — B001 Herpesviral vesicular dermatitis: Secondary | ICD-10-CM | POA: Diagnosis not present

## 2016-04-26 DIAGNOSIS — K644 Residual hemorrhoidal skin tags: Secondary | ICD-10-CM

## 2016-04-26 DIAGNOSIS — K648 Other hemorrhoids: Secondary | ICD-10-CM

## 2016-04-26 MED ORDER — HYDROCORTISONE 2.5 % RE CREA: 1.0000 "application " | TOPICAL_CREAM | Freq: Two times a day (BID) | RECTAL | 1 refills | Status: DC

## 2016-04-26 MED ORDER — VALACYCLOVIR HCL 1 G PO TABS: ORAL_TABLET | ORAL | 0 refills | Status: DC

## 2016-04-26 NOTE — Progress Notes (Signed)
Chief Complaint  Patient presents with  . Hemorrhoids    lifelong sufferer-noticed last night she was having an issue. In a great deal of pain. Has been using preparation H cream and suppositories.    Yesterday her hemorrhoids started flaring up.  She had some diarrhea earlier that day.  She can feel a swollen hemorrhoid which is painful.  She denies any bleeding. She used preparation H cream and suppository, neither of which has helped very much, maybe shrunk it a little. Feels like "I have a turd stuck"  External hemorrhoids since she was a teenager. Often can use OTC agents such as Tucks or Preparation H.  They never really went away since her pregnancy (2.5 years ago). 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  Her hemorrhoids after pregnancy were quite large, but resolved.  It was after that that she had recurrence of fissures, and saw GI. She used a topical cream for the fissures after her pregnancy, which was helpful.  Colonoscopy 2005 for bleeding and painful stools. She reports having polyps, benign. Wasn't suggested to have any follow-up.  This was when she lived in [place], [place].  H/o cold sores.  She is asking for acyclovir prescription--that is what her brother takes. Gets them several times/year--depends on weather, stress, etc. She used to use topical zovirax, but it became less effective.  Recalls taking Valtrex in the past without problems.  Past Medical History:  Diagnosis Date  . Allergy   . Asthma   . Medical history non-contributory   . UTI (urinary tract infection)    history - 7 last year in 2013 per patient   Past Surgical History:  Procedure Laterality Date  . COLONOSCOPY    . CYSTOSCOPY    . WISDOM TOOTH EXTRACTION     Social History   Social History  . Marital status: Married    Spouse name: N/A  . Number of children: N/A  . Years of education: N/A   Occupational History  . Not on file.   Social History Main Topics  .  Smoking status: Former Smoker    Years: 10.00    Quit date: 10/03/2003  . Smokeless tobacco: Never Used  . Alcohol use No  . Drug use: No  . Sexual activity: Yes    Partners: Male    Birth control/ protection: Pill   Other Topics Concern  . Not on file   Social History Narrative   Married, 1 daughter. Art teacher at [employer]   Current Outpatient Prescriptions on File Prior to Visit  Medication Sig Dispense Refill  . albuterol (PROVENTIL HFA;VENTOLIN HFA) 108 (90 Base) MCG/ACT inhaler Inhale 2 puffs into the lungs every 6 (six) hours as needed for wheezing or shortness of breath. (Patient not taking: Reported on 04/26/2016) 1 Inhaler 0  . Norethindrone-Ethinyl Estradiol-Fe Biphas (LO LOESTRIN FE) 1 MG-10 MCG / 10 MCG tablet Take 1 tablet by mouth daily.     No current facility-administered medications on file prior to visit.    Allergies  Allergen Reactions  . Macrobid [Nitrofurantoin Macrocrystal] Anaphylaxis   ROS: no fever, chills, URI symptoms, cough, shortness of breath, wheezing, chest pain. No nausea, vomiting, abdominal pain.  +recent loose stool/diarrhea. No bleeding, bruising.  She reports intermittent cold sores (see HPI). Reports intermittent itchy bumps on her fingers/hands--very worried that herpes could be starting to spread (spread to nose from lip).   Denies other complaints, except as per HPI.  PHYSICAL  EXAM: BP 138/80   Pulse 60   Ht  (1.6 m)   Wt 148 lb 9.6 oz (67.4 kg)   LMP 04/10/2016   BMI 26.32 kg/m   Well developed, pleasant female in no distress. She appears to be sitting comfortably on the exam table. HEENT: conjunctiva and sclera are clear.  Lips intact without any lesions currently Rectum:  1-1.5 cm non-thrombosed hemorrhoid on the left side of the anus. Soft, no thrombosis. No bleeding Skin: normal turgor, no lesions Psych: normal mood, affect, hygiene and grooming.  Slightly anxious, many questions Neuro: alert and oriented, cranial  nerves, strength and gait normal.  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  Discussed proper treatment and prevention.  Reassured that it is unlikely to be related to herpes.  External hemorrhoid--discussed OTC vs rx treatments, high fiber diet, drinking plenty of fluids, no straining. Discussed thrombosed hemorrhoids and treatments, as well as more definitive treatments of hemorrhoids.  Currently, hemorrhoids are inflamed, non-thrombosed, external. Treat with Anusol HC rx, sitz baths.  F/u prn.  Herpes labialis--discussed proper directions for Valtrex.  If too expensive, contact us and we can change to the acyclovir.  Try to get copies of the colonoscopy and pathology report of any polyps done in [place], [place] from 2005.  If there were adenomatous polyps, follow-up colonoscopy might be needed.  She had many questions re: potential for increased risk of cancer due to rectal trauma.  She had many questions.  Spent over 25-30 minutes with patient, more than half spent counseling and answering her many questions.

## 2016-04-26 NOTE — Patient Instructions (Signed)
Hemorrhoids Hemorrhoids are swollen veins around the rectum or anus. There are two types of hemorrhoids:   Internal hemorrhoids. These occur in the veins just inside the rectum. They may poke through to the outside and become irritated and painful.  External hemorrhoids. These occur in the veins outside the anus and can be felt as a painful swelling or hard lump near the anus. CAUSES  Pregnancy.   Obesity.   Constipation or diarrhea.   Straining to have a bowel movement.   Sitting for long periods on the toilet.  Heavy lifting or other activity that caused you to strain.  Anal intercourse. SYMPTOMS   Pain.   Anal itching or irritation.   Rectal bleeding.   Fecal leakage.   Anal swelling.   One or more lumps around the anus.  DIAGNOSIS  Your caregiver may be able to diagnose hemorrhoids by visual examination. Other examinations or tests that may be performed include:   Examination of the rectal area with a gloved hand (digital rectal exam).   Examination of anal canal using a small tube (scope).   A blood test if you have lost a significant amount of blood.  A test to look inside the colon (sigmoidoscopy or colonoscopy). TREATMENT Most hemorrhoids can be treated at home. However, if symptoms do not seem to be getting better or if you have a lot of rectal bleeding, your caregiver may perform a procedure to help make the hemorrhoids get smaller or remove them completely. Possible treatments include:   Placing a rubber band at the base of the hemorrhoid to cut off the circulation (rubber band ligation).   Injecting a chemical to shrink the hemorrhoid (sclerotherapy).   Using a tool to burn the hemorrhoid (infrared light therapy).   Surgically removing the hemorrhoid (hemorrhoidectomy).   Stapling the hemorrhoid to block blood flow to the tissue (hemorrhoid stapling).  HOME CARE INSTRUCTIONS   Eat foods with fiber, such as whole grains, beans,  nuts, fruits, and vegetables. Ask your doctor about taking products with added fiber in them (fibersupplements).  Increase fluid intake. Drink enough water and fluids to keep your urine clear or pale yellow.   Exercise regularly.   Go to the bathroom when you have the urge to have a bowel movement. Do not wait.   Avoid straining to have bowel movements.   Keep the anal area dry and clean. Use wet toilet paper or moist towelettes after a bowel movement.   Medicated creams and suppositories may be used or applied as directed.   Only take over-the-counter or prescription medicines as directed by your caregiver.   Take warm sitz baths for 15-20 minutes, 3-4 times a day to ease pain and discomfort.   Place ice packs on the hemorrhoids if they are tender and swollen. Using ice packs between sitz baths may be helpful.   Put ice in a plastic bag.   Place a towel between your skin and the bag.   Leave the ice on for 15-20 minutes, 3-4 times a day.   Do not use a donut-shaped pillow or sit on the toilet for long periods. This increases blood pooling and pain.  SEEK MEDICAL CARE IF:  You have increasing pain and swelling that is not controlled by treatment or medicine.  You have uncontrolled bleeding.  You have difficulty or you are unable to have a bowel movement.  You have pain or inflammation outside the area of the hemorrhoids. MAKE SURE YOU:  Understand these instructions.    Will watch your condition.  Will get help right away if you are not doing well or get worse.   This information is not intended to replace advice given to you by your health care provider. Make sure you discuss any questions you have with your health care provider.   Document Released: 09/15/2000 Document Revised: 09/04/2012 Document Reviewed: 07/23/2012 Elsevier Interactive Patient Education 2016 [employer].   How to Take a [employer] A sitz bath is a warm water bath that is taken  while you are sitting down. The water should only come up to your hips and should cover your buttocks. Your health care provider may recommend a sitz bath to help you:   Clean the lower part of your body, including your genital area.  With itching.  With pain.  With sore muscles or muscles that tighten or spasm. HOW TO TAKE A SITZ BATH Take 3-4 sitz baths per day or as told by your health care provider. 1. Partially fill a bathtub with warm water. You will only need the water to be deep enough to cover your hips and buttocks when you are sitting in it. 2. If your health care provider told you to put medicine in the water, follow the directions exactly. 3. Sit in the water and open the tub drain a little. 4. Turn on the warm water again to keep the tub at the correct level. Keep the water running constantly. 5. Soak in the water for 15-20 minutes or as told by your health care provider. 6. After the sitz bath, pat the affected area dry first. Do not rub it. 7. Be careful when you stand up after the sitz bath because you may feel dizzy. SEEK MEDICAL CARE IF:  Your symptoms get worse. Do not continue with sitz baths if your symptoms get worse.  You have new symptoms. Do not continue with sitz baths until you talk with your health care provider.   This information is not intended to replace advice given to you by your health care provider. Make sure you discuss any questions you have with your health care provider.   Document Released: 06/10/2004 Document Revised: 02/02/2015 Document Reviewed: 09/16/2014 Elsevier Interactive Patient Education [employer]

## 2016-05-10 ENCOUNTER — Encounter: Payer: Self-pay | Admitting: Family Medicine

## 2016-05-10 ENCOUNTER — Ambulatory Visit (INDEPENDENT_AMBULATORY_CARE_PROVIDER_SITE_OTHER): Payer: Commercial Managed Care - PPO | Admitting: Family Medicine

## 2016-05-10 VITALS — BP 110/70 | HR 60 | Ht 63.5 in | Wt 148.2 lb

## 2016-05-10 DIAGNOSIS — L298 Other pruritus: Secondary | ICD-10-CM

## 2016-05-10 DIAGNOSIS — Z0000 Encounter for general adult medical examination without abnormal findings: Secondary | ICD-10-CM

## 2016-05-10 DIAGNOSIS — Z8709 Personal history of other diseases of the respiratory system: Secondary | ICD-10-CM | POA: Insufficient documentation

## 2016-05-10 DIAGNOSIS — N898 Other specified noninflammatory disorders of vagina: Secondary | ICD-10-CM | POA: Insufficient documentation

## 2016-05-10 HISTORY — DX: Other specified noninflammatory disorders of vagina: N89.8

## 2016-05-10 LAB — POCT URINALYSIS DIPSTICK
Bilirubin, UA: NEGATIVE
Blood, UA: NEGATIVE
GLUCOSE UA: NEGATIVE
KETONES UA: NEGATIVE
Leukocytes, UA: NEGATIVE
Nitrite, UA: NEGATIVE
Protein, UA: NEGATIVE
Urobilinogen, UA: NEGATIVE
pH, UA: 6

## 2016-05-10 MED ORDER — FLUCONAZOLE 150 MG PO TABS: 150.0000 mg | ORAL_TABLET | Freq: Once | ORAL | 0 refills | Status: AC

## 2016-05-10 NOTE — Progress Notes (Signed)
Subjective:    Patient ID: Judy Shaw, female    DOB: [birth year], 35 y.o.   MRN: [id number]  HPI Chief Complaint  Patient presents with  . Annual Exam    fasting cpe, coughing-wheezing- yesterday but fine today. itchy in the vagina   She is new to the practice and here for a complete physical exam. Concerns with 1 week history of vaginal itching. No discharge or odor.  Has some wheezing and is occasionally using her albuterol inhaler, used it 3 times since her visit. Denies history of asthma. Lived in [place] as child. In 2004 she was diagnosed with asthma bronchitis and used an inhaler and was on inhaled corticosteroid for a while. She was smoking at that time and stopped smoking after that diagnosis.  States she usually wheezes and coughs during the night and not with activity. wheezing wakes her up in middle of night. Starts coughing as well.    Used cream for hemorrhoids and they cleared up.  Previous medical careCystoscopy in [place] for UTIs.  OB/GYN at [facility] for past 5 years and switched.  Last CPE: years ago   Other providers: OB/GYN now at [facility]   Past medical history: 2 pregnancies. One miscarriage. 1 child living 56 1/2 years old.  Surgeries: none   Family history: diverticulitis. HTN brother. GERD. Alzheimers MGF. COPD  Social history: Lives with husband and child , works as a Publishing copy, Denies smoking, drug use, 1 glass of wine nightly  Diet: no particular diet but tries to eat fiber and low calorie. 5 small meals per day.  Excerise: walk/jog, 2 days ropes course at [facility]   Immunizations: up to date with pregnancy 2 1/2 years ago   Health maintenance:  Mammogram: never Colonoscopy: 2005 in [place]. For fissures and hemorrhoids. Polyps were found but no follow up. Did not get these records.  Last Gynecological Exam: with pap smear 11/2015 Last Menstrual cycle: 04/10/2016  Pregnancies: 2 Contraception:  OCP Declines STD testing Last Dental Exam: regular visits. Is due.  Last Eye Exam: never   Wears seatbelt always, uses sunscreen, smoke detectors in home and functioning, does not text while driving and feels safe in home environment.   Reviewed allergies, medications, past medical, surgical, family, and social history.   Review of Systems Review of Systems Constitutional: -fever, -chills, -sweats, -unexpected weight change,-fatigue ENT: -runny nose, -ear pain, -sore throat Cardiology:  -chest pain, -palpitations, -edema Respiratory: + occasional cough, -shortness of breath, + occasional wheezing Gastroenterology: -abdominal pain, -nausea, -vomiting, -diarrhea, -constipation  Hematology: -bleeding or bruising problems Musculoskeletal: -arthralgias, -myalgias, -joint swelling, -back pain Ophthalmology: -vision changes Urology: -dysuria, -difficulty urinating, -hematuria, -urinary frequency, -urgency Neurology: -headache, -weakness, -tingling, -numbness       Objective:   Physical Exam BP 110/70   Pulse 60   Ht 5' 3.5" (1.613 m)   Wt 148 lb 3.2 oz (67.2 kg)   LMP 04/10/2016   BMI 25.84 kg/m   General Appearance:    Alert, cooperative, no distress, appears stated age  Head:    Normocephalic, without obvious abnormality, atraumatic  Eyes:    PERRL, conjunctiva/corneas clear, EOM's intact, fundi    benign  Ears:    Normal TM's and external ear canals  Nose:   Nares normal, mucosa normal, no drainage or sinus   tenderness  Throat:   Lips, mucosa, and tongue normal; teeth and gums normal  Neck:   Supple, no lymphadenopathy;  thyroid:  no   enlargement/tenderness/nodules; no carotid   bruit or JVD  Back:    Spine nontender, no curvature, ROM normal, no CVA     tenderness  Lungs:     Clear to auscultation bilaterally without wheezes, rales or     ronchi; respirations unlabored  Chest Wall:    No tenderness or deformity   Heart:    Regular rate and rhythm, S1 and S2 normal, no  murmur, rub   or gallop  Breast Exam:   deferred. Done at OB/GYN    No axillary lymphadenopathy  Abdomen:     Soft, non-tender, nondistended, normoactive bowel sounds,    no masses, no hepatosplenomegaly  Genitalia:    Normal external genitalia without lesions.  BUS and vagina normal; cervix without lesions, or cervical motion tenderness. No abnormal vaginal discharge.  Uterus and adnexa not enlarged, nontender, no masses. Chaperone present.  Pap not performed- UTD per patient at OB/GYN.   Rectal:    Not performed due to age<40 and no related complaints  Extremities:   No clubbing, cyanosis or edema  Pulses:   2+ and symmetric all extremities  Skin:   Skin color, texture, turgor normal, no rashes or lesions  Lymph nodes:   Cervical, supraclavicular, and axillary nodes normal  Neurologic:   CNII-XII intact, normal strength, sensation and gait; reflexes 2+ and symmetric throughout          Psych:   Normal mood, affect, hygiene and grooming.    Urinalysis dipstick: negative      Assessment & Plan:  Routine general medical examination at a health care facility - Plan: POCT urinalysis dipstick, CBC with Differential/Platelet, Comprehensive metabolic panel, Lipid panel  Vaginal itching - Plan: fluconazole (DIFLUCAN) 150 MG tablet  History of asthma  Discussed that overall she appears healthy. Recommend that she get at least 150 minutes of physical activity per week and take no more than 2 days per week off from exercise. Discussed eating a healthy well balanced diet. She appears to be up-to-date on immunizations however these are not in her records. She did deliver her child 2-1/2 years ago. Discussed that if she is continuing to need albuterol inhaler more than 2 times during the day per month or more thn 1 time at night during the month that she will give us a call and she may need to try an inhaled corticosteroid or possible referral to pulmonologist. PFTs reviewed and no evidence of  obstruction at that time Wet mount with yeast present. Treat with diflucan.  Follow up in 1 year or sooner pending labs.

## 2016-05-10 NOTE — Patient Instructions (Addendum)
Return for fasting labs at your convenience. Orders are in the system. Keep us posted on wheezing and need to use your albuterol inhaler.  Try to get your records from your gastroenterologist in [place] so that we can see if you are in need of a colonoscopy.

## 2016-05-11 ENCOUNTER — Other Ambulatory Visit: Payer: Self-pay | Admitting: Family Medicine

## 2016-05-11 ENCOUNTER — Other Ambulatory Visit: Payer: Commercial Managed Care - PPO

## 2016-05-11 ENCOUNTER — Telehealth: Payer: Self-pay | Admitting: Family Medicine

## 2016-05-11 DIAGNOSIS — Z0000 Encounter for general adult medical examination without abnormal findings: Secondary | ICD-10-CM

## 2016-05-11 LAB — CBC WITH DIFFERENTIAL/PLATELET
BASOS ABS: 0 {cells}/uL (ref 0–200)
Basophils Relative: 0 %
EOS PCT: 4 %
Eosinophils Absolute: 316 cells/uL (ref 15–500)
HEMATOCRIT: 42.9 % (ref 35.0–45.0)
HEMOGLOBIN: 14.3 g/dL (ref 11.7–15.5)
LYMPHS ABS: 1896 {cells}/uL (ref 850–3900)
LYMPHS PCT: 24 %
MCH: 32.4 pg (ref 27.0–33.0)
MCHC: 33.3 g/dL (ref 32.0–36.0)
MCV: 97.1 fL (ref 80.0–100.0)
MONO ABS: 711 {cells}/uL (ref 200–950)
MPV: 10.2 fL (ref 7.5–12.5)
Monocytes Relative: 9 %
NEUTROS PCT: 63 %
Neutro Abs: 4977 cells/uL (ref 1500–7800)
Platelets: 204 10*3/uL (ref 140–400)
RBC: 4.42 MIL/uL (ref 3.80–5.10)
RDW: 12.8 % (ref 11.0–15.0)
WBC: 7.9 10*3/uL (ref 4.0–10.5)

## 2016-05-11 LAB — COMPREHENSIVE METABOLIC PANEL
ALBUMIN: 4.3 g/dL (ref 3.6–5.1)
ALT: 11 U/L (ref 6–29)
AST: 14 U/L (ref 10–30)
Alkaline Phosphatase: 51 U/L (ref 33–115)
BUN: 13 mg/dL (ref 7–25)
CALCIUM: 9.1 mg/dL (ref 8.6–10.2)
CHLORIDE: 104 mmol/L (ref 98–110)
CO2: 26 mmol/L (ref 20–31)
Creat: 0.77 mg/dL (ref 0.50–1.10)
GLUCOSE: 100 mg/dL — AB (ref 65–99)
Potassium: 5.5 mmol/L — ABNORMAL HIGH (ref 3.5–5.3)
Sodium: 137 mmol/L (ref 135–146)
Total Bilirubin: 0.5 mg/dL (ref 0.2–1.2)
Total Protein: 6.6 g/dL (ref 6.1–8.1)

## 2016-05-11 LAB — LIPID PANEL
CHOL/HDL RATIO: 2.4 ratio (ref <=5.0)
Cholesterol: 181 mg/dL (ref 125–200)
HDL: 76 mg/dL (ref >=46)
LDL CALC: 90 mg/dL (ref <130)
Triglycerides: 77 mg/dL (ref <150)
VLDL: 15 mg/dL (ref <30)

## 2016-05-11 NOTE — Telephone Encounter (Signed)
Pt had labs done today so I will fax form over tomorrow

## 2016-05-11 NOTE — Telephone Encounter (Signed)
Pt states that she left a form to be completed when she was here at her appt but it need the results of her labs that were drawn today so pt requesting that completed form be faxed to [employer] at [phone]

## 2016-05-12 ENCOUNTER — Telehealth: Payer: Self-pay

## 2016-05-12 ENCOUNTER — Other Ambulatory Visit: Payer: Self-pay | Admitting: Medical

## 2016-05-12 DIAGNOSIS — E875 Hyperkalemia: Secondary | ICD-10-CM

## 2016-05-12 LAB — HEMOGLOBIN A1C
HEMOGLOBIN A1C: 4.9 % (ref <5.7)
MEAN PLASMA GLUCOSE: 94 mg/dL

## 2016-05-12 NOTE — Telephone Encounter (Signed)
Pt returned your call via answering service.

## 2016-05-12 NOTE — Telephone Encounter (Signed)
Already spoke to patient.

## 2016-05-12 NOTE — Telephone Encounter (Signed)
Faxed over results to [employer]

## 2016-07-12 DIAGNOSIS — Z86010 Personal history of colonic polyps: Secondary | ICD-10-CM | POA: Insufficient documentation

## 2016-07-12 LAB — HM PAP SMEAR: HM Pap smear: NEGATIVE

## 2016-08-09 ENCOUNTER — Other Ambulatory Visit: Payer: Self-pay

## 2016-08-09 LAB — OB RESULTS CONSOLE ABO/RH: RH TYPE: POSITIVE

## 2016-08-09 LAB — OB RESULTS CONSOLE RPR: RPR: NONREACTIVE

## 2016-08-09 LAB — OB RESULTS CONSOLE GC/CHLAMYDIA
CHLAMYDIA, DNA PROBE: NEGATIVE
Gonorrhea: NEGATIVE

## 2016-08-09 LAB — OB RESULTS CONSOLE HIV ANTIBODY (ROUTINE TESTING): HIV: NONREACTIVE

## 2016-08-09 LAB — OB RESULTS CONSOLE HEPATITIS B SURFACE ANTIGEN: HEP B S AG: NEGATIVE

## 2016-08-09 LAB — OB RESULTS CONSOLE ANTIBODY SCREEN: ANTIBODY SCREEN: NEGATIVE

## 2016-08-09 LAB — OB RESULTS CONSOLE RUBELLA ANTIBODY, IGM: Rubella: IMMUNE

## 2016-10-02 NOTE — L&D Delivery Note (Signed)
Patient was C/C/+1 and pushed for 35 minutes with epidural.   NSVD  female infant, Apgars 8,8, weight P.   The patient had a midline second degree perineal episiotomy done for facilitating delivery from crowning - fetal head was tight against perineum and fetal tracing was having moderate variables- with pt's consent; repaired with 2-0 vicryl R. Fundus was firm. EBL was expected amount. Placenta was delivered intact. Vagina was clear.  Baby was vigorous and doing skin to skin with mother.  Rhylee Nunn A

## 2016-11-22 ENCOUNTER — Encounter (HOSPITAL_COMMUNITY): Payer: Self-pay | Admitting: Obstetrics and Gynecology

## 2016-11-22 ENCOUNTER — Other Ambulatory Visit (HOSPITAL_COMMUNITY): Payer: Self-pay | Admitting: Obstetrics and Gynecology

## 2016-11-22 DIAGNOSIS — Z3A27 27 weeks gestation of pregnancy: Secondary | ICD-10-CM

## 2016-11-22 DIAGNOSIS — Z3689 Encounter for other specified antenatal screening: Secondary | ICD-10-CM

## 2016-11-27 ENCOUNTER — Encounter (HOSPITAL_COMMUNITY): Payer: Self-pay | Admitting: *Deleted

## 2016-11-28 ENCOUNTER — Ambulatory Visit (HOSPITAL_COMMUNITY)
Admission: RE | Admit: 2016-11-28 | Discharge: 2016-11-28 | Disposition: A | Discharge status: Home / Self Care | Payer: Commercial Managed Care - PPO | Source: Ambulatory Visit | Attending: Obstetrics and Gynecology | Admitting: Obstetrics and Gynecology

## 2016-11-28 ENCOUNTER — Other Ambulatory Visit (HOSPITAL_COMMUNITY): Payer: Self-pay | Admitting: *Deleted

## 2016-11-28 ENCOUNTER — Encounter (HOSPITAL_COMMUNITY): Payer: Self-pay

## 2016-11-28 ENCOUNTER — Other Ambulatory Visit (HOSPITAL_COMMUNITY): Payer: Self-pay | Admitting: Obstetrics and Gynecology

## 2016-11-28 DIAGNOSIS — O09292 Supervision of pregnancy with other poor reproductive or obstetric history, second trimester: Secondary | ICD-10-CM | POA: Diagnosis not present

## 2016-11-28 DIAGNOSIS — O26842 Uterine size-date discrepancy, second trimester: Secondary | ICD-10-CM

## 2016-11-28 DIAGNOSIS — O09522 Supervision of elderly multigravida, second trimester: Secondary | ICD-10-CM

## 2016-11-28 DIAGNOSIS — Z8759 Personal history of other complications of pregnancy, childbirth and the puerperium: Secondary | ICD-10-CM

## 2016-11-28 DIAGNOSIS — O3507X0 Maternal care for (suspected) central nervous system malformation or damage in fetus, microcephaly, not applicable or unspecified: Secondary | ICD-10-CM | POA: Insufficient documentation

## 2016-11-28 DIAGNOSIS — Z363 Encounter for antenatal screening for malformations: Secondary | ICD-10-CM

## 2016-11-28 DIAGNOSIS — Z3689 Encounter for other specified antenatal screening: Secondary | ICD-10-CM

## 2016-11-28 DIAGNOSIS — O350XX0 Maternal care for (suspected) central nervous system malformation in fetus, not applicable or unspecified: Secondary | ICD-10-CM | POA: Insufficient documentation

## 2016-11-28 DIAGNOSIS — Z3A27 27 weeks gestation of pregnancy: Secondary | ICD-10-CM

## 2016-11-28 HISTORY — DX: Maternal care for (suspected) central nervous system malformation or damage in fetus, microcephaly, not applicable or unspecified: O35.07X0

## 2016-11-28 HISTORY — DX: Personal history of other complications of pregnancy, childbirth and the puerperium: Z87.59

## 2016-11-28 NOTE — Progress Notes (Signed)
Maternal Fetal Medicine Consultation  Requesting Provider(s): Claiborne Billingsallahan  Primary Ob: Claiborne Billingsallahan Reason for consultation: Previous child with IUGR, low HC in this pregnancy  HPI: 36 year old P1011 at 27+5 weeks referred for US showing low head circumfrence. Previous child had IUGR, 4 pounds 11 ounces at 39 weeks with a head circumfence in the 1st percentile. This child is now 2 and has no developmental or physical problems. This pregnancy has been completely unremarkable. No travel to Z[place]-endemic areas. No medication exposure, no flu-like illnesses. There are 2 cats in the house but husband handles the litter box. No raw meat ingestion OB History: OB History    Gravida Para Term Preterm AB Living   3 1 1  0 1 1   SAB TAB Ectopic Multiple Live Births   1 0 0 0 1      PMH:  Past Medical History:  Diagnosis Date  . Allergy   . Anal fissure   . Asthma   . External hemorrhoid   . Herpes labialis   . Medical history non-contributory   . UTI (urinary tract infection)    history - 7 last year in 2013 per patient    PSH:  Past Surgical History:  Procedure Laterality Date  . COLONOSCOPY    . CYSTOSCOPY    . WISDOM TOOTH EXTRACTION     Meds: See EPIC section Allergies: Macrobid Fh: See EPIC section Soc: See EPIC section  Review of Systems: no vaginal bleeding or cramping/contractions, no LOF, no nausea/vomiting. All other systems reviewed and are negative.  PNL: All within normal limits. Had low risk NIPT because of AMA   PE: See EPIC section  Please see separate document for fetal ultrasound report.  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 The patient has no risk factors for any pathologic causes of microcephaly, plus the HC has not descended into the -4 to -5 standard deviation range that we associate with pathologic microcephaly. The history on the previous child points to what is probably constitutionally small head size rather than a pathological process. This would be  confirmed by assessment of growth rate, so I have asked her to return in 4 weeks and we will recheck all growth parameters. I have tried to reassure the patient and her partner that it is very unlikely that this is a cause for concern      Thank you for the opportunity to be a part of the care of Judy Shaw. Please contact our office if we can be of further assistance.   I spent approximately 30 minutes with this patient with over 50% of time spent in face-to-face counseling.

## 2016-12-09 DIAGNOSIS — Z0100 Encounter for examination of eyes and vision without abnormal findings: Secondary | ICD-10-CM | POA: Diagnosis not present

## 2016-12-09 DIAGNOSIS — H43393 Other vitreous opacities, bilateral: Secondary | ICD-10-CM | POA: Diagnosis not present

## 2016-12-12 DIAGNOSIS — R5383 Other fatigue: Secondary | ICD-10-CM | POA: Diagnosis not present

## 2016-12-12 DIAGNOSIS — Z23 Encounter for immunization: Secondary | ICD-10-CM | POA: Diagnosis not present

## 2016-12-27 ENCOUNTER — Encounter (HOSPITAL_COMMUNITY): Payer: Self-pay

## 2016-12-27 ENCOUNTER — Ambulatory Visit (HOSPITAL_COMMUNITY)
Admission: RE | Admit: 2016-12-27 | Discharge: 2016-12-27 | Disposition: A | Discharge status: Home / Self Care | Payer: Commercial Managed Care - PPO | Source: Ambulatory Visit | Attending: Obstetrics and Gynecology | Admitting: Obstetrics and Gynecology

## 2016-12-27 DIAGNOSIS — Z3A31 31 weeks gestation of pregnancy: Secondary | ICD-10-CM | POA: Diagnosis not present

## 2016-12-27 DIAGNOSIS — O26843 Uterine size-date discrepancy, third trimester: Secondary | ICD-10-CM | POA: Diagnosis not present

## 2016-12-27 DIAGNOSIS — Z362 Encounter for other antenatal screening follow-up: Secondary | ICD-10-CM | POA: Diagnosis not present

## 2016-12-27 DIAGNOSIS — O09293 Supervision of pregnancy with other poor reproductive or obstetric history, third trimester: Secondary | ICD-10-CM | POA: Insufficient documentation

## 2016-12-27 DIAGNOSIS — O09522 Supervision of elderly multigravida, second trimester: Secondary | ICD-10-CM

## 2016-12-27 DIAGNOSIS — O09523 Supervision of elderly multigravida, third trimester: Secondary | ICD-10-CM | POA: Insufficient documentation

## 2017-01-22 DIAGNOSIS — Q02 Microcephaly: Secondary | ICD-10-CM | POA: Diagnosis not present

## 2017-01-22 LAB — OB RESULTS CONSOLE GBS: GBS: NEGATIVE

## 2017-02-16 ENCOUNTER — Other Ambulatory Visit: Payer: Self-pay | Admitting: Obstetrics and Gynecology

## 2017-02-22 ENCOUNTER — Inpatient Hospital Stay (HOSPITAL_COMMUNITY): Payer: Commercial Managed Care - PPO | Admitting: Anesthesiology

## 2017-02-22 ENCOUNTER — Encounter (HOSPITAL_COMMUNITY): Payer: Self-pay

## 2017-02-22 ENCOUNTER — Inpatient Hospital Stay (HOSPITAL_COMMUNITY)
Admission: AD | Admit: 2017-02-22 | Discharge: 2017-02-24 | DRG: 775 | Disposition: A | Discharge status: Home / Self Care | Payer: Commercial Managed Care - PPO | Source: Ambulatory Visit | Attending: Obstetrics and Gynecology | Admitting: Obstetrics and Gynecology

## 2017-02-22 DIAGNOSIS — Z3A40 40 weeks gestation of pregnancy: Secondary | ICD-10-CM | POA: Diagnosis not present

## 2017-02-22 DIAGNOSIS — Z87891 Personal history of nicotine dependence: Secondary | ICD-10-CM | POA: Diagnosis not present

## 2017-02-22 DIAGNOSIS — O09529 Supervision of elderly multigravida, unspecified trimester: Secondary | ICD-10-CM

## 2017-02-22 DIAGNOSIS — Z3490 Encounter for supervision of normal pregnancy, unspecified, unspecified trimester: Secondary | ICD-10-CM

## 2017-02-22 DIAGNOSIS — Z3493 Encounter for supervision of normal pregnancy, unspecified, third trimester: Secondary | ICD-10-CM | POA: Diagnosis not present

## 2017-02-22 HISTORY — DX: Supervision of elderly multigravida, unspecified trimester: O09.529

## 2017-02-22 LAB — CBC
HEMATOCRIT: 39.6 % (ref 36.0–46.0)
Hemoglobin: 13.4 g/dL (ref 12.0–15.0)
MCH: 33.4 pg (ref 26.0–34.0)
MCHC: 33.8 g/dL (ref 30.0–36.0)
MCV: 98.8 fL (ref 78.0–100.0)
Platelets: 164 10*3/uL (ref 150–400)
RBC: 4.01 MIL/uL (ref 3.87–5.11)
RDW: 14.2 % (ref 11.5–15.5)
WBC: 14.5 10*3/uL — ABNORMAL HIGH (ref 4.0–10.5)

## 2017-02-22 LAB — TYPE AND SCREEN
ABO/RH(D): A POS
ANTIBODY SCREEN: NEGATIVE

## 2017-02-22 LAB — ABO/RH: ABO/RH(D): A POS

## 2017-02-22 MED ORDER — OXYCODONE-ACETAMINOPHEN 5-325 MG PO TABS: 1.0000 | ORAL_TABLET | ORAL | Status: DC | PRN

## 2017-02-22 MED ORDER — PRENATAL MULTIVITAMIN CH
1.0000 | ORAL_TABLET | Freq: Every day | ORAL | Status: DC
  Administered 2017-02-23: 1 via ORAL
  Filled 2017-02-22: qty 1

## 2017-02-22 MED ORDER — FENTANYL 2.5 MCG/ML BUPIVACAINE 1/10 % EPIDURAL INFUSION (WH - ANES): 14.0000 mL/h | INTRAMUSCULAR | Status: DC | PRN

## 2017-02-22 MED ORDER — METHYLERGONOVINE MALEATE 0.2 MG/ML IJ SOLN: 0.2000 mg | INTRAMUSCULAR | Status: DC | PRN

## 2017-02-22 MED ORDER — EPHEDRINE 5 MG/ML INJ
10.0000 mg | INTRAVENOUS | Status: DC | PRN
  Filled 2017-02-22: qty 2

## 2017-02-22 MED ORDER — LACTATED RINGERS IV SOLN: 500.0000 mL | INTRAVENOUS | Status: DC | PRN

## 2017-02-22 MED ORDER — BUTORPHANOL TARTRATE 1 MG/ML IJ SOLN: 1.0000 mg | INTRAMUSCULAR | Status: DC | PRN

## 2017-02-22 MED ORDER — EPHEDRINE 5 MG/ML INJ: 10.0000 mg | INTRAVENOUS | Status: DC | PRN

## 2017-02-22 MED ORDER — LACTATED RINGERS IV SOLN
500.0000 mL | Freq: Once | INTRAVENOUS | Status: AC
  Administered 2017-02-22: 500 mL via INTRAVENOUS

## 2017-02-22 MED ORDER — LIDOCAINE HCL (PF) 1 % IJ SOLN
INTRAMUSCULAR | Status: DC | PRN
  Administered 2017-02-22 (×2): 5 mL via EPIDURAL

## 2017-02-22 MED ORDER — ACETAMINOPHEN 325 MG PO TABS: 650.0000 mg | ORAL_TABLET | ORAL | Status: DC | PRN

## 2017-02-22 MED ORDER — WITCH HAZEL-GLYCERIN EX PADS
1.0000 "application " | MEDICATED_PAD | CUTANEOUS | Status: DC | PRN
  Administered 2017-02-23: 1 via TOPICAL

## 2017-02-22 MED ORDER — LACTATED RINGERS IV SOLN
INTRAVENOUS | Status: DC
  Administered 2017-02-22: 125 mL/h via INTRAVENOUS
  Administered 2017-02-22: 21:00:00 via INTRAVENOUS
  Administered 2017-02-22: 125 mL/h via INTRAVENOUS

## 2017-02-22 MED ORDER — FENTANYL 2.5 MCG/ML BUPIVACAINE 1/10 % EPIDURAL INFUSION (WH - ANES)
14.0000 mL/h | INTRAMUSCULAR | Status: DC | PRN
  Administered 2017-02-22: 14 mL/h via EPIDURAL
  Filled 2017-02-22: qty 100

## 2017-02-22 MED ORDER — VALACYCLOVIR HCL 500 MG PO TABS
500.0000 mg | ORAL_TABLET | Freq: Every day | ORAL | Status: DC
  Administered 2017-02-23 – 2017-02-24 (×2): 500 mg via ORAL
  Filled 2017-02-22 (×2): qty 1

## 2017-02-22 MED ORDER — ONDANSETRON HCL 4 MG/2ML IJ SOLN
4.0000 mg | Freq: Four times a day (QID) | INTRAMUSCULAR | Status: DC | PRN
  Administered 2017-02-22: 4 mg via INTRAVENOUS

## 2017-02-22 MED ORDER — SENNOSIDES-DOCUSATE SODIUM 8.6-50 MG PO TABS
2.0000 | ORAL_TABLET | ORAL | Status: DC
  Administered 2017-02-23 (×2): 2 via ORAL
  Filled 2017-02-22 (×2): qty 2

## 2017-02-22 MED ORDER — DIBUCAINE 1 % RE OINT: 1.0000 "application " | TOPICAL_OINTMENT | RECTAL | Status: DC | PRN

## 2017-02-22 MED ORDER — FLEET ENEMA 7-19 GM/118ML RE ENEM: 1.0000 | ENEMA | RECTAL | Status: DC | PRN

## 2017-02-22 MED ORDER — OXYTOCIN 40 UNITS IN LACTATED RINGERS INFUSION - SIMPLE MED
2.5000 [IU]/h | INTRAVENOUS | Status: DC
  Administered 2017-02-22: 2.5 [IU]/h via INTRAVENOUS

## 2017-02-22 MED ORDER — MAGNESIUM HYDROXIDE 400 MG/5ML PO SUSP: 30.0000 mL | ORAL | Status: DC | PRN

## 2017-02-22 MED ORDER — SOD CITRATE-CITRIC ACID 500-334 MG/5ML PO SOLN: 30.0000 mL | ORAL | Status: DC | PRN

## 2017-02-22 MED ORDER — ONDANSETRON HCL 4 MG/2ML IJ SOLN: 4.0000 mg | INTRAMUSCULAR | Status: DC | PRN

## 2017-02-22 MED ORDER — OXYTOCIN 40 UNITS IN LACTATED RINGERS INFUSION - SIMPLE MED
1.0000 m[IU]/min | INTRAVENOUS | Status: DC
  Administered 2017-02-22: 2 m[IU]/min via INTRAVENOUS
  Filled 2017-02-22: qty 1000

## 2017-02-22 MED ORDER — IBUPROFEN 800 MG PO TABS
800.0000 mg | ORAL_TABLET | Freq: Three times a day (TID) | ORAL | Status: DC
  Administered 2017-02-22 – 2017-02-24 (×5): 800 mg via ORAL
  Filled 2017-02-22 (×4): qty 1

## 2017-02-22 MED ORDER — ONDANSETRON HCL 4 MG/2ML IJ SOLN
4.0000 mg | Freq: Four times a day (QID) | INTRAMUSCULAR | Status: DC | PRN
  Filled 2017-02-22: qty 2

## 2017-02-22 MED ORDER — LIDOCAINE HCL (PF) 1 % IJ SOLN
30.0000 mL | INTRAMUSCULAR | Status: DC | PRN
  Filled 2017-02-22: qty 30

## 2017-02-22 MED ORDER — MEASLES, MUMPS & RUBELLA VAC ~~LOC~~ INJ
0.5000 mL | INJECTION | Freq: Once | SUBCUTANEOUS | Status: DC
  Filled 2017-02-22: qty 0.5

## 2017-02-22 MED ORDER — OXYCODONE-ACETAMINOPHEN 5-325 MG PO TABS: 2.0000 | ORAL_TABLET | ORAL | Status: DC | PRN

## 2017-02-22 MED ORDER — LACTATED RINGERS IV SOLN: INTRAVENOUS | Status: DC

## 2017-02-22 MED ORDER — TETANUS-DIPHTH-ACELL PERTUSSIS 5-2.5-18.5 LF-MCG/0.5 IM SUSP: 0.5000 mL | Freq: Once | INTRAMUSCULAR | Status: DC

## 2017-02-22 MED ORDER — BENZOCAINE-MENTHOL 20-0.5 % EX AERO
1.0000 "application " | INHALATION_SPRAY | CUTANEOUS | Status: DC | PRN
  Administered 2017-02-23: 1 via TOPICAL
  Filled 2017-02-22: qty 56

## 2017-02-22 MED ORDER — METHYLERGONOVINE MALEATE 0.2 MG PO TABS: 0.2000 mg | ORAL_TABLET | ORAL | Status: DC | PRN

## 2017-02-22 MED ORDER — DIPHENHYDRAMINE HCL 25 MG PO CAPS: 25.0000 mg | ORAL_CAPSULE | Freq: Four times a day (QID) | ORAL | Status: DC | PRN

## 2017-02-22 MED ORDER — OXYTOCIN BOLUS FROM INFUSION: 500.0000 mL | Freq: Once | INTRAVENOUS | Status: DC

## 2017-02-22 MED ORDER — SIMETHICONE 80 MG PO CHEW: 80.0000 mg | CHEWABLE_TABLET | ORAL | Status: DC | PRN

## 2017-02-22 MED ORDER — OXYTOCIN 40 UNITS IN LACTATED RINGERS INFUSION - SIMPLE MED: 2.5000 [IU]/h | INTRAVENOUS | Status: DC

## 2017-02-22 MED ORDER — DIPHENHYDRAMINE HCL 50 MG/ML IJ SOLN: 12.5000 mg | INTRAMUSCULAR | Status: DC | PRN

## 2017-02-22 MED ORDER — PHENYLEPHRINE 40 MCG/ML (10ML) SYRINGE FOR IV PUSH (FOR BLOOD PRESSURE SUPPORT)
80.0000 ug | PREFILLED_SYRINGE | INTRAVENOUS | Status: DC | PRN
  Filled 2017-02-22: qty 5

## 2017-02-22 MED ORDER — OXYTOCIN BOLUS FROM INFUSION
500.0000 mL | Freq: Once | INTRAVENOUS | Status: AC
  Administered 2017-02-22: 500 mL via INTRAVENOUS

## 2017-02-22 MED ORDER — SODIUM CHLORIDE 0.9% FLUSH: 3.0000 mL | Freq: Two times a day (BID) | INTRAVENOUS | Status: DC

## 2017-02-22 MED ORDER — SODIUM CHLORIDE 0.9% FLUSH: 3.0000 mL | INTRAVENOUS | Status: DC | PRN

## 2017-02-22 MED ORDER — SODIUM CHLORIDE 0.9 % IV SOLN: 250.0000 mL | INTRAVENOUS | Status: DC | PRN

## 2017-02-22 MED ORDER — PHENYLEPHRINE 40 MCG/ML (10ML) SYRINGE FOR IV PUSH (FOR BLOOD PRESSURE SUPPORT): 80.0000 ug | PREFILLED_SYRINGE | INTRAVENOUS | Status: DC | PRN

## 2017-02-22 MED ORDER — COCONUT OIL OIL: 1.0000 "application " | TOPICAL_OIL | Status: DC | PRN

## 2017-02-22 MED ORDER — FERROUS SULFATE 325 (65 FE) MG PO TABS
325.0000 mg | ORAL_TABLET | Freq: Two times a day (BID) | ORAL | Status: DC
  Administered 2017-02-23 – 2017-02-24 (×3): 325 mg via ORAL
  Filled 2017-02-22 (×3): qty 1

## 2017-02-22 MED ORDER — TERBUTALINE SULFATE 1 MG/ML IJ SOLN
0.2500 mg | Freq: Once | INTRAMUSCULAR | Status: DC | PRN
  Filled 2017-02-22: qty 1

## 2017-02-22 MED ORDER — PHENYLEPHRINE 40 MCG/ML (10ML) SYRINGE FOR IV PUSH (FOR BLOOD PRESSURE SUPPORT)
80.0000 ug | PREFILLED_SYRINGE | INTRAVENOUS | Status: DC | PRN
  Filled 2017-02-22: qty 5
  Filled 2017-02-22: qty 10

## 2017-02-22 MED ORDER — HYDROCORTISONE 2.5 % RE CREA
1.0000 "application " | TOPICAL_CREAM | Freq: Two times a day (BID) | RECTAL | Status: DC
  Administered 2017-02-20 – 2017-02-24 (×4): 1 via RECTAL
  Filled 2017-02-22: qty 28.35

## 2017-02-22 MED ORDER — ZOLPIDEM TARTRATE 5 MG PO TABS: 5.0000 mg | ORAL_TABLET | Freq: Every evening | ORAL | Status: DC | PRN

## 2017-02-22 MED ORDER — ONDANSETRON HCL 4 MG PO TABS: 4.0000 mg | ORAL_TABLET | ORAL | Status: DC | PRN

## 2017-02-22 MED ORDER — LACTATED RINGERS IV SOLN: 500.0000 mL | Freq: Once | INTRAVENOUS | Status: DC

## 2017-02-22 MED ORDER — ALBUTEROL SULFATE (2.5 MG/3ML) 0.083% IN NEBU: 3.0000 mL | INHALATION_SOLUTION | Freq: Four times a day (QID) | RESPIRATORY_TRACT | Status: DC | PRN

## 2017-02-22 NOTE — Anesthesia Preprocedure Evaluation (Signed)
Anesthesia Evaluation  Patient identified by MRN, date of birth, ID band Patient awake    Reviewed: Allergy & Precautions, H&P , NPO status , Patient's Chart, lab work & pertinent test results  Airway Mallampati: II   Neck ROM: full    Dental   Pulmonary former smoker,    breath sounds clear to auscultation       Cardiovascular negative cardio ROS   Rhythm:regular Rate:Normal     Neuro/Psych    GI/Hepatic   Endo/Other    Renal/GU      Musculoskeletal   Abdominal   Peds  Hematology   Anesthesia Other Findings   Reproductive/Obstetrics (+) Pregnancy                             Anesthesia Physical Anesthesia Plan  ASA: II  Anesthesia Plan: Epidural   Post-op Pain Management:    Induction: Intravenous  Airway Management Planned: Natural Airway  Additional Equipment:   Intra-op Plan:   Post-operative Plan:   Informed Consent: I have reviewed the patients History and Physical, chart, labs and discussed the procedure including the risks, benefits and alternatives for the proposed anesthesia with the patient or authorized representative who has indicated his/her understanding and acceptance.     Plan Discussed with: Anesthesiologist  Anesthesia Plan Comments:         Anesthesia Quick Evaluation

## 2017-02-22 NOTE — Anesthesia Pain Management Evaluation Note (Signed)
  CRNA Pain Management Visit Note  Patient: Judy Shaw, 36 y.o., female  "Hello I am a member of the anesthesia team at [facility]. We have an anesthesia team available at all times to provide care throughout the hospital, including epidural management and anesthesia for C-section. I don't know your plan for the delivery whether it a natural birth, water birth, IV sedation, nitrous supplementation, doula or epidural, but we want to meet your pain goals."   1.Was your pain managed to your expectations on prior hospitalizations?   Yes   2.What is your expectation for pain management during this hospitalization?     Epidural  3.How can we help you reach that goal? unsure  Record the patient's initial score and the patient's pain goal.   Pain: 4  Pain Goal: 8 [facility] wants you to be able to say your pain was always managed very well.  Cephus ShellingBURGER,Dariyon Urquilla 02/22/2017

## 2017-02-22 NOTE — H&P (Signed)
36 y.o. 4993w0d  G3P1011 comes in for induction at term for AMA.  Otherwise has good fetal movement and no bleeding.  Past Medical History:  Diagnosis Date  . Allergy   . Anal fissure   . Asthma   . External hemorrhoid   . Herpes labialis   . Medical history non-contributory   . UTI (urinary tract infection)    history - 7 last year in 2013 per patient    Past Surgical History:  Procedure Laterality Date  . COLONOSCOPY    . CYSTOSCOPY    . WISDOM TOOTH EXTRACTION      OB History  Gravida Para Term Preterm AB Living  3 1 1  0 1 1  SAB TAB Ectopic Multiple Live Births  1 0 0 0 1    # Outcome Date GA Lbr Len/2nd Weight Sex Delivery Anes PTL Lv  3 Current           2 Term 10/31/13 6968w0d 01:57 / 02:41 4 lb 11.8 oz (2.149 kg) F Vag-Vacuum Spinal  LIV  1 SAB               Social History   Social History  . Marital status: Married    Spouse name: N/A  . Number of children: N/A  . Years of education: N/A   Occupational History  . Not on file.   Social History Main Topics  . Smoking status: Former Smoker    Years: 10.00    Quit date: 10/03/2003  . Smokeless tobacco: Never Used  . Alcohol use No  . Drug use: No  . Sexual activity: Yes    Partners: Male   Other Topics Concern  . Not on file   Social History Narrative   Married, 1 daughter. Art Runner, broadcasting/film/videoteacher at [employer]   Macrobid [nitrofurantoin macrocrystal]    Prenatal Transfer Tool  Maternal Diabetes: No Genetic Screening: Normal Maternal Ultrasounds/Referrals: Abnormal:  Findings:   Other: Initial US suspected microcephaly but MFm follow up showed normal growth and no other issues.  Fetal Ultrasounds or other Referrals:  Referred to Materal Fetal Medicine  Maternal Substance Abuse:  No Significant Maternal Medications:  None Significant Maternal Lab Results: None  Other PNC: Otherwise uncomplicated.  Vitals:   02/22/17 0836  BP: 134/74  Pulse: 89  Resp: 16  Temp: 97.9 F (36.6 C)  TempSrc: Oral   Weight: 182 lb (82.6 kg)  Height: 5\' 2"  (1.575 m)    Lungs/Cor:  NAD Abdomen:  soft, gravid Ex:  no cords, erythema SVE: 3/50/-3, well applied, AROm clear FHTs:  130s, good STV, NST R Toco:  qocc   A/P   Term induction for AMA.  GBS neg.  Judy Shaw A

## 2017-02-22 NOTE — Anesthesia Procedure Notes (Signed)
Epidural Patient location during procedure: OB Start time: 02/22/2017 3:41 PM End time: 02/22/2017 3:51 PM  Staffing Anesthesiologist: Chaney MallingHODIERNE, Jarita Raval Performed: anesthesiologist   Preanesthetic Checklist Completed: patient identified, site marked, pre-op evaluation, timeout performed, IV checked, risks and benefits discussed and monitors and equipment checked  Epidural Patient position: sitting Prep: DuraPrep Patient monitoring: heart rate, cardiac monitor, continuous pulse ox and blood pressure Approach: midline Location: L2-L3 Injection technique: LOR saline  Needle:  Needle type: Tuohy  Needle gauge: 17 G Needle length: 9 cm Needle insertion depth: 5 cm Catheter type: closed end flexible Catheter size: 19 Gauge Catheter at skin depth: 10 cm Test dose: negative and Other  Assessment Events: blood not aspirated, injection not painful, no injection resistance and negative IV test  Additional Notes Informed consent obtained prior to proceeding including risk of failure, 1% risk of PDPH, risk of minor discomfort and bruising.  Discussed rare but serious complications including epidural abscess, permanent nerve injury, epidural hematoma.  Discussed alternatives to epidural analgesia and patient desires to proceed.  Timeout performed pre-procedure verifying patient name, procedure, and platelet count.  Patient tolerated procedure well. Reason for block:procedure for pain

## 2017-02-23 LAB — CBC
HCT: 31.8 % — ABNORMAL LOW (ref 36.0–46.0)
Hemoglobin: 10.9 g/dL — ABNORMAL LOW (ref 12.0–15.0)
MCH: 33.7 pg (ref 26.0–34.0)
MCHC: 34.3 g/dL (ref 30.0–36.0)
MCV: 98.5 fL (ref 78.0–100.0)
Platelets: 150 10*3/uL (ref 150–400)
RBC: 3.23 MIL/uL — ABNORMAL LOW (ref 3.87–5.11)
RDW: 14.3 % (ref 11.5–15.5)
WBC: 20 10*3/uL — ABNORMAL HIGH (ref 4.0–10.5)

## 2017-02-23 LAB — RPR: RPR: NONREACTIVE

## 2017-02-23 NOTE — Discharge Summary (Signed)
Obstetric Discharge Summary Reason for Admission: induction of labor Prenatal Procedures: none Intrapartum Procedures: spontaneous vaginal delivery and episiotomy midline Postpartum Procedures: none Complications-Operative and Postpartum: 2 degree perineal laceration Hemoglobin  Date Value Ref Range Status  02/23/2017 10.9 (L) 12.0 - 15.0 g/dL Final   HCT  Date Value Ref Range Status  02/23/2017 31.8 (L) 36.0 - 46.0 % Final    Physical Exam:  General: alert, cooperative and appears stated age 3Lochia: appropriate Uterine Fundus: firm DVT Evaluation: No evidence of DVT seen on physical exam.  Discharge Diagnoses: Term Pregnancy-delivered  Discharge Information: Date: 02/23/2017 Activity: pelvic rest Diet: routine Medications: PNV and Ibuprofen Condition: stable Instructions: refer to practice specific booklet Discharge to: home Follow-up Information    Carrington ClampHorvath, Michelle, MD Follow up in 4 week(s).   Specialty:  Obstetrics and Gynecology Contact information: [address] [place] [id number] [phone]           Newborn Data: Live born female  Birth Weight: 7 lb 7.4 oz (3385 g) APGAR: 8, 8  Home with mother.  Kedra Mcglade GEFFEL Mischele Detter 02/23/2017, 12:20 PM

## 2017-02-23 NOTE — Anesthesia Postprocedure Evaluation (Signed)
Anesthesia Post Note  Patient: Judy Shaw  Procedure(s) Performed: * No procedures listed *  Patient location during evaluation: Mother Baby Anesthesia Type: Epidural Level of consciousness: awake and alert and oriented Pain management: satisfactory to patient Vital Signs Assessment: post-procedure vital signs reviewed and stable Respiratory status: spontaneous breathing and nonlabored ventilation Cardiovascular status: stable Postop Assessment: no headache, no backache, no signs of nausea or vomiting, adequate PO intake and patient able to bend at knees (patient up walking) Anesthetic complications: no        Last Vitals:  Vitals:   02/23/17 0100 02/23/17 0512  BP: (!) 115/46 (!) 97/41  Pulse: 83 67  Resp: 18 18  Temp: 37.1 C 36.8 C    Last Pain:  Vitals:   02/23/17 0705  TempSrc:   PainSc: 0-No pain   Pain Goal: Patients Stated Pain Goal: 3 (02/23/17 0100)               Madison HickmanGREGORY,Llesenia Fogal

## 2017-02-23 NOTE — Lactation Note (Signed)
This note was copied from a baby's chart. Lactation Consultation Note  Patient Name: Judy Shaw ZOXWR'UToday's Date: 02/23/2017 Reason for consult: Follow-up assessment;Breast/nipple pain  Visited with Mom, baby 2113 hrs old.  Mom had baby on breast in football hold, baby latched a bit shallow.  Mom has already requested Comfort Gels for sore nipples.  Assisted with using proper breast support.  Adjusted pillows for proper support of baby.  Baby able to latch when Mom sandwiches her breast to accommodate a deeper latch.  Swallowing identified.  Basics reviewed with Mom.  Encouraged her to call for assistance at next latch.   Encouraged STS and cue based feedings.  Consult Status Consult Status: Follow-up Date: 02/24/17 Follow-up type: In-patient    [name], [name] 02/23/2017, 11:08 AM

## 2017-02-23 NOTE — Lactation Note (Signed)
This note was copied from a baby's chart. Lactation Consultation Note Baby 5 hrs old. Mom stated just finished BF for 30 min. 2nd baby, mom BF 1st baby now 343 1/37 yr old for 9 months. That baby was 4 lbs, didn't empty her breast enough, got mastitis early after birth, pumping helped until baby got older to empty breast. No further problems. Mom has everted nipples. Lt. Breast has positional stripe, mom requesting comfort gels. Gave CG. Hand expression w/colostrum noted.  Reviewed newborn behavior, feeding habits,and STS. Encouraged STS, I&O. Mom encouraged to feed baby 8-12 times/24 hours and with feeding cues.  WH/LC brochure given w/resources, support groups and LC services.  Patient Name: Girl Vickii PennaJennifer Schrag WUJWJ'XToday's Date: 02/23/2017 Reason for consult: Initial assessment   Maternal Data Has patient been taught Hand Expression?: Yes Does the patient have breastfeeding experience prior to this delivery?: Yes  Feeding Feeding Type: Breast Fed Length of feed: 30 min  LATCH Score/Interventions       Type of Nipple: Everted at rest and after stimulation  Comfort (Breast/Nipple): Filling, red/small blisters or bruises, mild/mod discomfort  Problem noted: Cracked, bleeding, blisters, bruises;Mild/Moderate discomfort Interventions  (Cracked/bleeding/bruising/blister): Expressed breast milk to nipple Interventions (Mild/moderate discomfort): Comfort gels;Hand massage  Hold (Positioning): No assistance needed to correctly position infant at breast.     Lactation Tools Discussed/Used Tools: Comfort gels   Consult Status Consult Status: Follow-up Date: 02/23/17 (in pm) Follow-up type: In-patient    Naeem Quillin, Diamond NickelLAURA G 02/23/2017, 3:21 AM

## 2017-02-23 NOTE — Progress Notes (Signed)
Patient is doing well.  She is ambulating, voiding, tolerating PO.  Pain control is good.  Lochia is appropriate  Vitals:   02/22/17 2315 02/22/17 2343 02/23/17 0100 02/23/17 0512  BP: (!) 110/56 (!) 117/51 (!) 115/46 (!) 97/41  Pulse: 85 83 83 67  Resp:  18 18 18   Temp:  98.8 F (37.1 C) 98.8 F (37.1 C) 98.3 F (36.8 C)  TempSrc:  Oral Oral Oral  SpO2:  99% 97% 98%  Weight:      Height:        NAD Fundus firm Ext: 1+ edema b/l  Lab Results  Component Value Date   WBC 20.0 (H) 02/23/2017   HGB 10.9 (L) 02/23/2017   HCT 31.8 (L) 02/23/2017   MCV 98.5 02/23/2017   PLT 150 02/23/2017    --/--/A POS, A POS (05/24 0837)/RImmune  A/P 36 y.o. [id number] PPD#1. Routine care.   Expect d/c tomorrow.    Kirkbride CenterDYANNA GEFFEL [employer] CompanyCLARK

## 2017-02-23 NOTE — Progress Notes (Signed)
Patient is doing well.  She is ambulating, voiding, tolerating PO.  Pain control is good.  Lochia is appropriate  Vitals:   02/22/17 2315 02/22/17 2343 02/23/17 0100 02/23/17 0512  BP: (!) 110/56 (!) 117/51 (!) 115/46 (!) 97/41  Pulse: 85 83 83 67  Resp:  18 18 18   Temp:  98.8 F (37.1 C) 98.8 F (37.1 C) 98.3 F (36.8 C)  TempSrc:  Oral Oral Oral  SpO2:  99% 97% 98%  Weight:      Height:        NAD Fundus firm Ext: no edema  Lab Results  Component Value Date   WBC 20.0 (H) 02/23/2017   HGB 10.9 (L) 02/23/2017   HCT 31.8 (L) 02/23/2017   MCV 98.5 02/23/2017   PLT 150 02/23/2017    --/--/A POS, A POS (05/24 0837)/RImmune  A/P 36 y.o. [id number] PPD#1. Routine care.   Expect d/c tomorrow.    [facility] GEFFEL [employer]CLARK

## 2017-02-24 ENCOUNTER — Ambulatory Visit: Payer: Self-pay

## 2017-02-24 NOTE — Discharge Instructions (Signed)
Please take motrin 600 mg every 6 hours as needed for pain. You may add 650mg  of tylenol as needed every 6 hours.

## 2017-02-24 NOTE — Progress Notes (Signed)
Patient is doing well.  She is ambulating, voiding, tolerating PO.  Pain control is good.  Lochia is appropriate  Vitals:   02/23/17 0100 02/23/17 0512 02/23/17 1716 02/24/17 0544  BP: (!) 115/46 (!) 97/41 110/63 (!) 95/45  Pulse: 83 67 80 (!) 56  Resp: 18 18 18 18   Temp: 98.8 F (37.1 C) 98.3 F (36.8 C) 98.4 F (36.9 C) 97.7 F (36.5 C)  TempSrc: Oral Oral Oral Oral  SpO2: 97% 98%    Weight:      Height:        NAD Fundus firm Ext: no edema  Lab Results  Component Value Date   WBC 20.0 (H) 02/23/2017   HGB 10.9 (L) 02/23/2017   HCT 31.8 (L) 02/23/2017   MCV 98.5 02/23/2017   PLT 150 02/23/2017    --/--/A POS, A POS (05/24 0837)/RImmune  A/P 36 y.o. [id number] PPD2. Routine care.   Meeting all goals.  D/c to home  [facility]NA Shaw [place]

## 2017-02-24 NOTE — Lactation Note (Signed)
This note was copied from a baby's chart. Lactation Consultation Note  Patient Name: Girl Vickii PennaJennifer Vanmeter ZOXWR'UToday's Date: 02/24/2017 Reason for consult: Follow-up assessment;Other (Comment) (per mom nipples are aliitle tender , no breakdown )  Mom / baby ready for Discharge Baby is 7837 hours old - and per mom breast feeding is going well. LC updated doc flow sheets - per mom with recent feedings.  Per mom using comfort gels.  LC instructed mom on the use of shells.  Sore nipple and engorgement prevention and tx reviewed.  Per mom has  DEBP at home [facility] )  Mother informed of post-discharge support and given phone number to the lactation department,  including services for phone call assistance; out-patient appointments; and breastfeeding support  group. List of other breastfeeding resources in the community given in the handout. Encouraged  mother to call for problems or concerns related to breastfeeding.   Maternal Data    Feeding Feeding Type:  (baby recently breast fed ) Length of feed: 10 min (per mom )  LATCH Score/Interventions                Intervention(s): Breastfeeding basics reviewed     Lactation Tools Discussed/Used Tools: Shells;Comfort gels (mom already has Comfort gels, and today LC instructed mom on the use shells ) Shell Type: Inverted WIC Program: No   Consult Status Consult Status: Complete Date: 02/24/17    Kathrin GreathouseMargaret Ann Adolpho Meenach 02/24/2017, 11:05 AM

## 2017-03-01 ENCOUNTER — Telehealth (HOSPITAL_COMMUNITY): Payer: Self-pay

## 2017-03-01 NOTE — Telephone Encounter (Signed)
Mother reports that infant is having sufficient wet and dirty diapers and is gaining weight. Mother concerned that infant is not opening her mouth wide enough. She reports that her nipples are sore. Several tips given to mother. Mother request to have an outpatient visit. She wanted an appt for Friday but none available. appt was scheduled for Monday. Mother states she may come to support group on Tuesday. She will call back to cancel if she changes her mind. Mother was scheduled for outpatient visit on June 4 at 2:00

## 2017-03-02 ENCOUNTER — Ambulatory Visit: Payer: Self-pay

## 2017-03-02 NOTE — Lactation Note (Signed)
This note was copied from a baby's chart. Lactation Consult for Judy Shaw (DOB: [birth year]) and mother, Vickii PennaJennifer Betton  Mother's reason for visit: "proper latch technique" Consult:  Initial Lactation Consultant:  Remigio Eisenmengerichey, Daijanae Rafalski [place]  ________________________________________________________________________ BW: 3385g (7# 7.4oz) D/c wt: 3245g (7# 2.5oz) down 4% Wt at peds on 5-27: 7# 1oz Wt at peds on 5-29: 7# 4 oz Today's weight: 3470g, about 7# 10.4 (without diaper), 3486g, 7# 11oz (with clean, dry diaper) ________________________________________________________________________  Mother's Name: Stoney BangHelen Lourdes Godbolt Type of delivery:  Vaginal, Spontaneous Delivery Breastfeeding Experience:  2nd child, nursed for 9 months Maternal Medical Conditions:  None Maternal Medications:  APNO (began using yesterday)  ________________________________________________________________________  Breastfeeding History (Post Discharge)  Frequency of breastfeeding: 12-18 times/day Duration of feeding: 7-18 min  Patient does not supplement or pump.  Infant Intake and Output Assessment  Voids: 6+ in 24 hrs.  Color:  Clear yellow Stools: 4+ in 24 hrs.  Color:  Yellow  ________________________________________________________________________  Maternal Breast Assessment  Breast:  Full Nipple:  Erect  _______________________________________________________________________ Feeding Assessment/Evaluation  Initial feeding assessment:  Infant's oral assessment:  WNL  Attached assessment:  Deep  Lips flanged:  Yes.    Lips untucked:  No.  Suck assessment:  Nutritive  Pre-feed weight: 3486 g  Post-feed weight: 3506 g  Amount transferred: 20 ml in 6 minutes  Total amount transferred: 20 ml  Infant is 348 days old and is 3 oz above BW. Mom felt that her nipples were "more irritated" since her time in [facility] hospital. Mom reports that infant coughs and gags during "a lot of Judy  feedings."   I observed Myriam JacobsonHelen latch with mom in a laid-back position. She did latch slightly shallow w/a more narrow gape at Judy beginning, but soon began to relax her jaw. Frequent swallows were immediately noted. Mom was comfortable after Judy initial tenderness subsided. When infant released latch, Judy nipple was slightly creased, but that was also possibly because infant had allowed nipple to sit in Judy anterior portion of Judy mouth before finally releasing Judy latch.  I recommended that Mom do some jaw massage to see if that would release some jaw tightness before latching & during Judy very initial latch (infant was somewhat resistant to lowering Judy mandible). No coughing or gagging was noted while mom fed in Judy laid-back position.   Mom's nipples appear to be healing. She reported that there were clear blisters on Judy tips of her nipples while in Judy hospital (since resolved). Since discharge, mom reported that both of her nipples had had a significant portion of Judy nipple surface/tip scabbed. However, scabbing is gone & now there is nontraumatized skin of a light pink color on Judy nipple surfaces bilaterally. Only a very minute scab still exists on Judy R nipple. Also is evidence of bilateral nipple piercing from years ago. Mom does wear shells because she dislikes Judy feeling of anything brushing against Judy nipples. Mom has been using APNO since yesterday (she had called in to her MD's office). My impression is that mom's nipple trauma is resolving & her nipples will soon be back to their original shape. Mom reports that this happened when nursing her 1st child, but it then resolved.     When lactogenesis II occurred, Mom did notice Judy appearance of a very small, palpable oblong-shaped lump near Judy areola (about 11 o'clock). It does have Judy feel of a milk duct, but it has not decreased in size since its appearance. Although  likely benign, I asked that Mom show this to her MD if it is still present  at her postpartum check-up.   Glenetta Hew, RN, IBCLC

## 2017-03-19 ENCOUNTER — Telehealth: Payer: Self-pay | Admitting: Medical

## 2017-03-19 NOTE — Telephone Encounter (Signed)
Please call and congratulate her on her new baby.  We hope she is doing well

## 2017-04-05 ENCOUNTER — Ambulatory Visit: Payer: Self-pay

## 2017-04-05 NOTE — Lactation Note (Signed)
This note was copied from a baby's chart. Lactation Consult  Mother's reason for visit: Mother here with concerns about milk supply and infant clicking when breastfeeding.  Visit Type: Mother reports that she thinks infant has a poor latch.  Appointment Notes: mother denies having any nipple soreness. Monday 10-5 at [facility] office Consult:  Follow-Up Lactation Consultant:  Michel BickersKendrick, Sou Nohr McCoy  ________________________________________________________________________    ________________________________________________________________________  Mother's Name: Judy Shaw Type of delivery:  Vaginal, Spontaneous Delivery Breastfeeding Experience:  none Maternal Medical Conditions:  none Maternal Medications: Prenatal vits , Reglan 10 mg Qid, mother reports that she has only had 4 doses and she thinks it caused her to have anxiety  ________________________________________________________________________  Breastfeeding History (Post Discharge)  Frequency of breastfeeding:  8-15 times daily Duration of feeding: 3-6 min per breast    Pumping  Type of pump:  Medela Senatra Frequency: 5 times total, once daily Volume: 9 ounces before feeding, Power pumping 9 ounces  Infant Intake and Output Assessment  Voids: 7-8 in 24 hrs.  Color:  Clear yellow Stools:4-5 in 24 hrs.  Color:  Yellow  ________________________________________________________________________  Maternal Breast Assessment  Breast:  Full Nipple:  Erect Pain level:  0 Pain interventions:  Bra  _______________________________________________________________________ Feeding Assessment/Evaluation:   Infant latched on with a shallow latch, taking in only the nipple. Attempt to flange infants lips and each time infant came off breast. Observed that infant has a suck blister in center of upper lip and lower lip is blanched white. Infant is unable to flange upper lip and bottom lip rolls under.  Infant sustained  latch for 2-3 mins on the first breast and 1-2 mins on the alternate breast. Mother has a very rapid milk ejection and infant chokes several times during a feeding. Infant makes a sound , that is assisting infant with managing milk flow. This is not a clicking sound.   Lots of discussion about infants lip and tongue., mother reports that Peds doesn't think that infant has a tongue tie. She is concerned that infant does have a lip tie and tongue tie . Mother concerned that if not revised it with affect her milk supply, mother to go back to work in 4 weeks.    Infant's oral assessment:  Variance, cups tongue, high palate, upper lip tie and possible submucosal tongue tie, short thick frenula.   Positioning:  Cross cradle Right breast  LATCH documentation:  Latch:  2 = Grasps breast easily, tongue down, lips flanged, rhythmical sucking.  Audible swallowing:  2 = Spontaneous and intermittent  Type of nipple:  2 = Everted at rest and after stimulation  Comfort (Breast/Nipple):  1 = Filling, red/small blisters or bruises, mild/mod discomfort  Hold (Positioning):  2 = No assistance needed to correctly position infant at breast  LATCH score:  9  Attached assessment:  Shallow  Lips flanged:  No.  Lips untucked:  No.  Suck assessment:  Displays both   Pre-feed weight:  10-10.4,4830 Post-feed weight: 10-12.4,4888 Amount transferred: 58 ml  Additional Feeding Assessment -   Infant's oral assessment:  Variance  Positioning:  Cross cradle Left breast  LATCH documentation:  Latch:  2 = Grasps breast easily, tongue down, lips flanged, rhythmical sucking.  Audible swallowing:  2 = Spontaneous and intermittent  Type of nipple:  2 = Everted at rest and after stimulation  Comfort (Breast/Nipple):  1 = Filling, red/small blisters or bruises, mild/mod discomfort  Hold (Positioning):  1 = Assistance needed to correctly  position infant at breast and maintain latch  LATCH score:  8  Attached  assessment:  Shallow  Lips flanged:  No.  Lips untucked:  No.  Suck assessment:  Displays both  Pre-feed weight: 4888 Post-feed weight: 4904  Amount transferred: 16 ml  Total amount transferred:  74 ml Advised mother to follow up for tongue evaluation with Dr Lexine Baton to give mother peace of mind.  Mother to continue to cue base feed infant  Advised to work on better depth , latching infant on with an off sided latch and keeping infants chin tucked in Suggested that she post pump 1-2 times daily since going back to work. Mother to follow up for regular weight checks, BFSG,S LC prn Peds follow up in am .

## 2017-05-11 ENCOUNTER — Ambulatory Visit (INDEPENDENT_AMBULATORY_CARE_PROVIDER_SITE_OTHER): Payer: Commercial Managed Care - PPO | Admitting: Family Medicine

## 2017-05-11 ENCOUNTER — Encounter: Payer: Self-pay | Admitting: Family Medicine

## 2017-05-11 VITALS — BP 110/70 | HR 70 | Ht 64.0 in | Wt 157.2 lb

## 2017-05-11 DIAGNOSIS — R195 Other fecal abnormalities: Secondary | ICD-10-CM | POA: Insufficient documentation

## 2017-05-11 DIAGNOSIS — M79672 Pain in left foot: Secondary | ICD-10-CM | POA: Diagnosis not present

## 2017-05-11 DIAGNOSIS — Z0000 Encounter for general adult medical examination without abnormal findings: Secondary | ICD-10-CM

## 2017-05-11 DIAGNOSIS — M79671 Pain in right foot: Secondary | ICD-10-CM

## 2017-05-11 LAB — POCT URINALYSIS DIP (PROADVANTAGE DEVICE)
Bilirubin, UA: NEGATIVE
Blood, UA: NEGATIVE
Glucose, UA: NEGATIVE mg/dL
Ketones, POC UA: NEGATIVE mg/dL
LEUKOCYTES UA: NEGATIVE
Nitrite, UA: NEGATIVE
PH UA: 6 (ref 5.0–8.0)
PROTEIN UA: NEGATIVE mg/dL
SPECIFIC GRAVITY, URINE: 1.03
Urobilinogen, Ur: NEGATIVE

## 2017-05-11 LAB — LIPID PANEL
Cholesterol: 182 mg/dL (ref <200)
HDL: 69 mg/dL (ref >50)
LDL CALC: 103 mg/dL — AB (ref <100)
Total CHOL/HDL Ratio: 2.6 Ratio (ref <5.0)
Triglycerides: 52 mg/dL (ref <150)
VLDL: 10 mg/dL (ref <30)

## 2017-05-11 LAB — CBC WITH DIFFERENTIAL/PLATELET
Basophils Absolute: 0 cells/uL (ref 0–200)
Basophils Relative: 0 %
EOS PCT: 3 %
Eosinophils Absolute: 201 cells/uL (ref 15–500)
HCT: 40.1 % (ref 35.0–45.0)
Hemoglobin: 13.6 g/dL (ref 11.7–15.5)
LYMPHS PCT: 25 %
Lymphs Abs: 1675 cells/uL (ref 850–3900)
MCH: 31.9 pg (ref 27.0–33.0)
MCHC: 33.9 g/dL (ref 32.0–36.0)
MCV: 93.9 fL (ref 80.0–100.0)
MONOS PCT: 7 %
MPV: 9.8 fL (ref 7.5–12.5)
Monocytes Absolute: 469 cells/uL (ref 200–950)
NEUTROS PCT: 65 %
Neutro Abs: 4355 cells/uL (ref 1500–7800)
PLATELETS: 199 10*3/uL (ref 140–400)
RBC: 4.27 MIL/uL (ref 3.80–5.10)
RDW: 12.5 % (ref 11.0–15.0)
WBC: 6.7 10*3/uL (ref 4.0–10.5)

## 2017-05-11 LAB — COMPREHENSIVE METABOLIC PANEL
ALT: 22 U/L (ref 6–29)
AST: 17 U/L (ref 10–30)
Albumin: 4.3 g/dL (ref 3.6–5.1)
Alkaline Phosphatase: 77 U/L (ref 33–115)
BILIRUBIN TOTAL: 0.3 mg/dL (ref 0.2–1.2)
BUN: 13 mg/dL (ref 7–25)
CALCIUM: 9 mg/dL (ref 8.6–10.2)
CO2: 23 mmol/L (ref 20–32)
CREATININE: 0.73 mg/dL (ref 0.50–1.10)
Chloride: 109 mmol/L (ref 98–110)
GLUCOSE: 88 mg/dL (ref 65–99)
Potassium: 4.4 mmol/L (ref 3.5–5.3)
SODIUM: 142 mmol/L (ref 135–146)
Total Protein: 6.5 g/dL (ref 6.1–8.1)

## 2017-05-11 LAB — TSH: TSH: 1.99 mIU/L

## 2017-05-11 NOTE — Patient Instructions (Signed)
For your foot pain: wear good supportive shoes at all times while standing and walking. Stretch, do ice massage and take an anti-inflammatory as tolerated. If you do not get better in a week or two then let me know.   Keep your stools soft by staying well hydrated. You can try stool softeners and daily Miralax.     Plantar Fasciitis Plantar fasciitis is a painful foot condition that affects the heel. It occurs when the band of tissue that connects the toes to the heel bone (plantar fascia) becomes irritated. This can happen after exercising too much or doing other repetitive activities (overuse injury). The pain from plantar fasciitis can range from mild irritation to severe pain that makes it difficult for you to walk or move. The pain is usually worse in the morning or after you have been sitting or lying down for a while. What are the causes? This condition may be caused by:  Standing for long periods of time.  Wearing shoes that do not fit.  Doing high-impact activities, including running, aerobics, and ballet.  Being overweight.  Having an abnormal way of walking (gait).  Having tight calf muscles.  Having high arches in your feet.  Starting a new athletic activity.  What are the signs or symptoms? The main symptom of this condition is heel pain. Other symptoms include:  Pain that gets worse after activity or exercise.  Pain that is worse in the morning or after resting.  Pain that goes away after you walk for a few minutes.  How is this diagnosed? This condition may be diagnosed based on your signs and symptoms. Your health care provider will also do a physical exam to check for:  A tender area on the bottom of your foot.  A high arch in your foot.  Pain when you move your foot.  Difficulty moving your foot.  You may also need to have imaging studies to confirm the diagnosis. These can include:  X-rays.  Ultrasound.  MRI.  How is this treated? Treatment  for plantar fasciitis depends on the severity of the condition. Your treatment may include:  Rest, ice, and over-the-counter pain medicines to manage your pain.  Exercises to stretch your calves and your plantar fascia.  A splint that holds your foot in a stretched, upward position while you sleep (night splint).  Physical therapy to relieve symptoms and prevent problems in the future.  Cortisone injections to relieve severe pain.  Extracorporeal shock wave therapy (ESWT) to stimulate damaged plantar fascia with electrical impulses. It is often used as a last resort before surgery.  Surgery, if other treatments have not worked after 12 months.  Follow these instructions at home:  Take medicines only as directed by your health care provider.  Avoid activities that cause pain.  Roll the bottom of your foot over a bag of ice or a bottle of cold water. Do this for 20 minutes, 3-4 times a day.  Perform simple stretches as directed by your health care provider.  Try wearing athletic shoes with air-sole or gel-sole cushions or soft shoe inserts.  Wear a night splint while sleeping, if directed by your health care provider.  Keep all follow-up appointments with your health care provider. How is this prevented?  Do not perform exercises or activities that cause heel pain.  Consider finding low-impact activities if you continue to have problems.  Lose weight if you need to. The best way to prevent plantar fasciitis is to avoid the  activities that aggravate your plantar fascia. Contact a health care provider if:  Your symptoms do not go away after treatment with home care measures.  Your pain gets worse.  Your pain affects your ability to move or do your daily activities. This information is not intended to replace advice given to you by your health care provider. Make sure you discuss any questions you have with your health care provider. Document Released: 06/13/2001 Document  Revised: 02/21/2016 Document Reviewed: 07/29/2014 Elsevier Interactive Patient Education  2018 [employer].    Preventative Care for Adults - Female      MAINTAIN REGULAR HEALTH EXAMS:  A routine yearly physical is a good way to check in with your primary care provider about your health and preventive screening. It is also an opportunity to share updates about your health and any concerns you have, and receive a thorough all-over exam.   Most health insurance companies pay for at least some preventative services.  Check with your health plan for specific coverages.  WHAT PREVENTATIVE SERVICES DO WOMEN NEED?  Adult women should have their weight and blood pressure checked regularly.   Women age 35 and older should have their cholesterol levels checked regularly.  Women should be screened for cervical cancer with a Pap smear and pelvic exam beginning at either age 9, or 3 years after they become sexually activity.    Breast cancer screening generally begins at age 40 with a mammogram and breast exam by your primary care provider.    Beginning at age 37 and continuing to age 93, women should be screened for colorectal cancer.  Certain people may need continued testing until age 32.  Updating vaccinations is part of preventative care.  Vaccinations help protect against diseases such as the flu.  Osteoporosis is a disease in which the bones lose minerals and strength as we age. Women ages 67 and over should discuss this with their caregivers, as should women after menopause who have other risk factors.  Lab tests are generally done as part of preventative care to screen for anemia and blood disorders, to screen for problems with the kidneys and liver, to screen for bladder problems, to check blood sugar, and to check your cholesterol level.  Preventative services generally include counseling about diet, exercise, avoiding tobacco, drugs, excessive alcohol consumption, and sexually  transmitted infections.    GENERAL RECOMMENDATIONS FOR GOOD HEALTH:  Healthy diet:  Eat a variety of foods, including fruit, vegetables, animal or vegetable protein, such as meat, fish, chicken, and eggs, or beans, lentils, tofu, and grains, such as rice.  Drink plenty of water daily.  Decrease saturated fat in the diet, avoid lots of red meat, processed foods, sweets, fast foods, and fried foods.  Exercise:  Aerobic exercise helps maintain good heart health. At least 30-40 minutes of moderate-intensity exercise is recommended. For example, a brisk walk that increases your heart rate and breathing. This should be done on most days of the week.   Find a type of exercise or a variety of exercises that you enjoy so that it becomes a part of your daily life.  Examples are running, walking, swimming, water aerobics, and biking.  For motivation and support, explore group exercise such as aerobic class, spin class, Zumba, Yoga,or  martial arts, etc.    Set exercise goals for yourself, such as a certain weight goal, walk or run in a race such as a 5k walk/run.  Speak to your primary care provider about exercise  goals.  Disease prevention:  If you smoke or chew tobacco, find out from your caregiver how to quit. It can literally save your life, no matter how long you have been a tobacco user. If you do not use tobacco, never begin.   Maintain a healthy diet and normal weight. Increased weight leads to problems with blood pressure and diabetes.   The Body Mass Index or BMI is a way of measuring how much of your body is fat. Having a BMI above 27 increases the risk of heart disease, diabetes, hypertension, stroke and other problems related to obesity. Your caregiver can help determine your BMI and based on it develop an exercise and dietary program to help you achieve or maintain this important measurement at a healthful level.  High blood pressure causes heart and blood vessel problems.  Persistent  high blood pressure should be treated with medicine if weight loss and exercise do not work.   Fat and cholesterol leaves deposits in your arteries that can block them. This causes heart disease and vessel disease elsewhere in your body.  If your cholesterol is found to be high, or if you have heart disease or certain other medical conditions, then you may need to have your cholesterol monitored frequently and be treated with medication.   Ask if you should have a cardiac stress test if your history suggests this. A stress test is a test done on a treadmill that looks for heart disease. This test can find disease prior to there being a problem.  Menopause can be associated with physical symptoms and risks. Hormone replacement therapy is available to decrease these. You should talk to your caregiver about whether starting or continuing to take hormones is right for you.   Osteoporosis is a disease in which the bones lose minerals and strength as we age. This can result in serious bone fractures. Risk of osteoporosis can be identified using a bone density scan. Women ages 4265 and over should discuss this with their caregivers, as should women after menopause who have other risk factors. Ask your caregiver whether you should be taking a calcium supplement and Vitamin D, to reduce the rate of osteoporosis.   Avoid drinking alcohol in excess (more than two drinks per day).  Avoid use of street drugs. Do not share needles with anyone. Ask for professional help if you need assistance or instructions on stopping the use of alcohol, cigarettes, and/or drugs.  Brush your teeth twice a day with fluoride toothpaste, and floss once a day. Good oral hygiene prevents tooth decay and gum disease. The problems can be painful, unattractive, and can cause other health problems. Visit your dentist for a routine oral and dental check up and preventive care every 6-12 months.   Look at your skin regularly.  Use a mirror to  look at your back. Notify your caregivers of changes in moles, especially if there are changes in shapes, colors, a size larger than a pencil eraser, an irregular border, or development of new moles.  Safety:  Use seatbelts 100% of the time, whether driving or as a passenger.  Use safety devices such as hearing protection if you work in environments with loud noise or significant background noise.  Use safety glasses when doing any work that could send debris in to the eyes.  Use a helmet if you ride a bike or motorcycle.  Use appropriate safety gear for contact sports.  Talk to your caregiver about gun safety.  Use sunscreen  with a SPF (or skin protection factor) of 15 or greater.  Lighter skinned people are at a greater risk of skin cancer. Don't forget to also wear sunglasses in order to protect your eyes from too much damaging sunlight. Damaging sunlight can accelerate cataract formation.   Practice safe sex. Use condoms. Condoms are used for birth control and to help reduce the spread of sexually transmitted infections (or STIs).  Some of the STIs are gonorrhea (the clap), chlamydia, syphilis, trichomonas, herpes, HPV (human papilloma virus) and HIV (human immunodeficiency virus) which causes AIDS. The herpes, HIV and HPV are viral illnesses that have no cure. These can result in disability, cancer and death.   Keep carbon monoxide and smoke detectors in your home functioning at all times. Change the batteries every 6 months or use a model that plugs into the wall.   Vaccinations:  Stay up to date with your tetanus shots and other required immunizations. You should have a booster for tetanus every 10 years. Be sure to get your flu shot every year, since 5%-20% of the [place] population comes down with the flu. The flu vaccine changes each year, so being vaccinated once is not enough. Get your shot in the fall, before the flu season peaks.   Other vaccines to consider:  Human Papilloma Virus or HPV  causes cancer of the cervix, and other infections that can be transmitted from person to person. There is a vaccine for HPV, and females should get immunized between the ages of 19 and 89. It requires a series of 3 shots.   Pneumococcal vaccine to protect against certain types of pneumonia.  This is normally recommended for adults age 35 or older.  However, adults younger than 37 years old with certain underlying conditions such as diabetes, heart or lung disease should also receive the vaccine.  Shingles vaccine to protect against Varicella Zoster if you are older than age 50, or younger than 37 years old with certain underlying illness.  Hepatitis A vaccine to protect against a form of infection of the liver by a virus acquired from food.  Hepatitis B vaccine to protect against a form of infection of the liver by a virus acquired from blood or body fluids, particularly if you work in health care.  If you plan to travel internationally, check with your local health department for specific vaccination recommendations.  Cancer Screening:  Breast cancer screening is essential to preventive care for women. All women age 61 and older should perform a breast self-exam every month. At age 20 and older, women should have their caregiver complete a breast exam each year. Women at ages 72 and older should have a mammogram (x-ray film) of the breasts. Your caregiver can discuss how often you need mammograms.    Cervical cancer screening includes taking a Pap smear (sample of cells examined under a microscope) from the cervix (end of the uterus). It also includes testing for HPV (Human Papilloma Virus, which can cause cervical cancer). Screening and a pelvic exam should begin at age 12, or 3 years after a woman becomes sexually active. Screening should occur every year, with a Pap smear but no HPV testing, up to age 33. After age 86, you should have a Pap smear every 3 years with HPV testing, if no HPV was  found previously.   Most routine colon cancer screening begins at the age of 75. On a yearly basis, doctors may provide special easy to use take-home tests to check  for hidden blood in the stool. Sigmoidoscopy or colonoscopy can detect the earliest forms of colon cancer and is life saving. These tests use a small camera at the end of a tube to directly examine the colon. Speak to your caregiver about this at age 58, when routine screening begins (and is repeated every 5 years unless early forms of pre-cancerous polyps or small growths are found).

## 2017-05-11 NOTE — Progress Notes (Signed)
Subjective:    Patient ID: Judy Shaw, female    DOB: 08-10-1981, 36 y.o.   MRN: [id number]  HPI Chief Complaint  Patient presents with  . fasting cpe    fasting cpe. pain in feet- had plantar factitis. pain in rectal due to hemorrhoids. sees obgyn for pap. 6 months ago had eye exam   She is here for a complete physical exam. Her 62 month old and 77 year old children are with her.  She is breastfeeding.   She also has 2 concerns today. She complains of bilateral foot pain for the past few weeks and states she thinks she has plantar fascitis. States pain is only when walking and is worse first thing in the morning. Has not tried anything for the pain. Denies numbness, tingling or weakness.   She also complains of hard stools and history of hemorrhoids. This is an ongoing problem for her for many years. States she forgets to take her fiber and then she has bouts of constipation and hemorrhoid or fissure flare ups. She has cream and suppositories at home for this.   Other providers: [facility] Claiborne Billings [facility] 11 weeks ago. Vaginal delivery. She had an episiotemy.   Asthma - no issues with this in years.   Social history: Lives with husband and 2 children, works as the [employer] of [employer]  Diet: fairly healthy  Excerise: nothing since delivering her child 11 months ago.   Depression screen PHQ 2/9 05/11/2017  Decreased Interest 0  Down, Depressed, Hopeless 0  PHQ - 2 Score 0    Immunizations: up to date  Health maintenance:  Mammogram: never  Colonoscopy: at age 38 for bleeding and was found to have hemorrhoids and fissure. Done in [place].  Last Dental Exam: twice annually  Last Eye Exam: 6 months ago  Contraception OCP.   Wears seatbelt always, uses sunscreen, smoke detectors in home and functioning, does not text while driving and feels safe in home environment.   Reviewed allergies, medications, past medical, surgical, family, and social  history.   Review of Systems Review of Systems Constitutional: -fever, -chills, -sweats, -unexpected weight change,-fatigue ENT: -runny nose, -ear pain, -sore throat Cardiology:  -chest pain, -palpitations, -edema Respiratory: -cough, -shortness of breath, -wheezing Gastroenterology: -abdominal pain, -nausea, -vomiting, -diarrhea, -constipation  Hematology: -bleeding or bruising problems Musculoskeletal: -arthralgias, -myalgias, -joint swelling, -back pain Ophthalmology: -vision changes Urology: -dysuria, -difficulty urinating, -hematuria, -urinary frequency, -urgency Neurology: -headache, -weakness, -tingling, -numbness       Objective:   Physical Exam BP 110/70   Pulse 70   Ht 5\' 4"  (1.626 m)   Wt 157 lb 3.2 oz (71.3 kg) Comment: WC- 37 inches  LMP 04/27/2017   Breastfeeding? Yes   BMI 26.98 kg/m   General Appearance:    Alert, cooperative, no distress, appears stated age  Head:    Normocephalic, without obvious abnormality, atraumatic  Eyes:    PERRL, conjunctiva/corneas clear, EOM's intact, fundi    benign  Ears:    Normal TM's and external ear canals  Nose:   Nares normal, mucosa normal, no drainage or sinus   tenderness  Throat:   Lips, mucosa, and tongue normal; teeth and gums normal  Neck:   Supple, no lymphadenopathy;  thyroid:  no   enlargement/tenderness/nodules; no carotid   bruit or JVD  Back:    Spine nontender, no curvature, ROM normal, no CVA     tenderness  Lungs:  Clear to auscultation bilaterally without wheezes, rales or     ronchi; respirations unlabored  Chest Wall:    No tenderness or deformity   Heart:    Regular rate and rhythm, S1 and S2 normal, no murmur, rub   or gallop  Breast Exam:    Done at OB/GYN  Abdomen:     Soft, non-tender, nondistended, normoactive bowel sounds,    no masses, no hepatosplenomegaly  Genitalia:    Done at OB/GYN  Rectal: declined   Extremities:   No clubbing, cyanosis or edema  Pulses:   2+ and symmetric all  extremities  Skin:   Skin color, texture, turgor normal, no rashes or lesions  Lymph nodes:   Cervical, supraclavicular, and axillary nodes normal  Neurologic:   CNII-XII intact, normal strength, sensation and gait; reflexes 2+ and symmetric throughout          Psych:   Normal mood, affect, hygiene and grooming.    Urinalysis dipstick: negative      Assessment & Plan:  Routine general medical examination at a health care facility - Plan: CBC with Differential/Platelet, Comprehensive metabolic panel, Lipid panel, POCT Urinalysis DIP (Proadvantage Device), TSH  Bilateral foot pain  Hard stool  She appears to be doing well overall both physically and emotionally. She is interacting appropriately with her 2 children.  Counseled on foot pain and conservative treatment for plantar fascitis which this appears to be related to. She will let me know if therapies are not improving her symptoms and we will refer her to podiatry.  Advised to try stool softeners, Miralax, stay well hydrated and be physically active to help improve her constipation.  Immunizations are up to date as well as health maintenance.  Discussed safety.  We will follow up pending labs.

## 2018-05-12 NOTE — Progress Notes (Signed)
Subjective:    Patient ID: Judy Shaw, female    DOB: September 08, 1981, 37 y.o.   MRN: [id number]  HPI Chief Complaint  Patient presents with  . cpe    cpe, issues with possible ADHD- feels all over the place with her mind- sees obgyn   She is here for a complete physical exam.  States her mother was diagnosed with Alzheimer's dementia. Mother is age 38.  Her father was diagnosed with Colorectal cancer and diabetes this past year. He is 71.    States she has been using Miralax and no longer having constipation. Having more regular bowel movements.  She has seen GI in the past. [facility].  States she had a colonoscopy in [place] at age 37 for irregular BM and rectal bleeding. She reports having polyps at that time.  No issues with hemorrhoids lately.   States she thinks she may have ADHD. States in college she was told she should be tested but did not get it. Does not recall having issues with this as a child or even as an adolescent.  States she has issues maintaining focus for a solid hour. 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 States her job now requires more focus for longer periods of time and often tasks that she does not look forward to.    Other providers: Dr. 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  Denies smoking, drinking alcohol, drug use  Diet: fairly healthy. Sass diet for the past 3 weeks  Excerise: nothing regular  Immunizations: up to date   Health maintenance:  Mammogram: N/A Colonoscopy: age 37 at [facility].  Last Gynecological Exam: more than a year. Pap smear up to date  Last Menstrual cycle: regular. Last one 3 weeks ago.  Last Dental Exam: twice annually  Last Eye Exam: in the past 2 years   Wears seatbelt always, uses sunscreen, smoke detectors in home  and functioning, does not text while driving and feels safe in home environment.   Reviewed allergies, medications, past medical, surgical, family, and social history.     Review of Systems Review of Systems Constitutional: -fever, -chills, -sweats, -unexpected weight change,-fatigue ENT: -runny nose, -ear pain, -sore throat Cardiology:  -chest pain, -palpitations, -edema Respiratory: -cough, -shortness of breath, -wheezing Gastroenterology: -abdominal pain, -nausea, -vomiting, -diarrhea, -constipation  Hematology: -bleeding or bruising problems Musculoskeletal: -arthralgias, -myalgias, -joint swelling, -back pain Ophthalmology: -vision changes Urology: -dysuria, -difficulty urinating, -hematuria, -urinary frequency, -urgency Neurology: -headache, -weakness, -tingling, -numbness       Objective:   Physical Exam BP 110/70   Pulse 65   Ht 5' 3.25" (1.607 m)   Wt 162 lb (73.5 kg)   LMP 04/01/2018   Breastfeeding? No   BMI 28.47 kg/m   General Appearance:    Alert, cooperative, no distress, appears stated age  Head:    Normocephalic, without obvious abnormality, atraumatic  Eyes:    PERRL, conjunctiva/corneas clear, EOM's intact, fundi    benign  Ears:    Normal TM's and external ear canals  Nose:   Nares normal, mucosa normal, no drainage or sinus   tenderness  Throat:   Lips, mucosa, and tongue normal; teeth and gums normal  Neck:   Supple, no lymphadenopathy;  thyroid:  no  enlargement/tenderness/nodules; no carotid   bruit or JVD  Back:    Spine nontender, no curvature, ROM normal, no CVA     tenderness  Lungs:     Clear to auscultation bilaterally without wheezes, rales or     ronchi; respirations unlabored  Chest Wall:    No tenderness or deformity   Heart:    Regular rate and rhythm, S1 and S2 normal, no murmur, rub   or gallop  Breast Exam:    OB/GYN  Abdomen:     Soft, non-tender, nondistended, normoactive bowel sounds,    no masses, no hepatosplenomegaly    Genitalia:    OB/GYN  Rectal:    Not performed due to age<40 and no related complaints  Extremities:   No clubbing, cyanosis or edema  Pulses:   2+ and symmetric all extremities  Skin:   Skin color, texture, turgor normal, no rashes or lesions  Lymph nodes:   Cervical, supraclavicular, and axillary nodes normal  Neurologic:   CNII-XII intact, normal strength, sensation and gait; reflexes 2+ and symmetric throughout          Psych:   Normal mood, affect, hygiene and grooming.     Urinalysis dipstick: negative       Assessment & Plan:  Routine general medical examination at a health care facility - Plan: POCT Urinalysis DIP (Proadvantage Device), CBC with Differential/Platelet, Comprehensive metabolic panel, TSH, T4, free, Lipid panel  Family history of rectal cancer  Attention and concentration deficit - Plan: CBC with Differential/Platelet, Comprehensive metabolic panel, TSH, T4, free  Screening for lipid disorders - Plan: Lipid panel  ADHD questionnaire inattentive component score 21 and hyperactivity component score 15  Questionable history of ADHD as a child but never tested. Plan to send her to [facility] for further evaluation since diagnosis is not clear. No medication prescribed today.  Questioned whether depression or anxiety may be playing a role but she denies.  Doing well otherwise.  Immunizations UTD.  Discussed healthy diet and exercise.  Also discussed safety and health promotion.  Follow up pending labs.

## 2018-05-13 ENCOUNTER — Ambulatory Visit (INDEPENDENT_AMBULATORY_CARE_PROVIDER_SITE_OTHER): Payer: Commercial Managed Care - PPO | Admitting: Family Medicine

## 2018-05-13 ENCOUNTER — Encounter: Payer: Self-pay | Admitting: Family Medicine

## 2018-05-13 VITALS — BP 110/70 | HR 65 | Ht 63.25 in | Wt 162.0 lb

## 2018-05-13 DIAGNOSIS — Z13220 Encounter for screening for lipoid disorders: Secondary | ICD-10-CM

## 2018-05-13 DIAGNOSIS — Z0000 Encounter for general adult medical examination without abnormal findings: Secondary | ICD-10-CM

## 2018-05-13 DIAGNOSIS — R7309 Other abnormal glucose: Secondary | ICD-10-CM | POA: Diagnosis not present

## 2018-05-13 DIAGNOSIS — R41840 Attention and concentration deficit: Secondary | ICD-10-CM | POA: Diagnosis not present

## 2018-05-13 DIAGNOSIS — Z800 Family history of malignant neoplasm of digestive organs: Secondary | ICD-10-CM | POA: Diagnosis not present

## 2018-05-13 LAB — POCT URINALYSIS DIP (PROADVANTAGE DEVICE)
Bilirubin, UA: NEGATIVE
Glucose, UA: NEGATIVE mg/dL
Ketones, POC UA: NEGATIVE mg/dL
Leukocytes, UA: NEGATIVE
Nitrite, UA: NEGATIVE
PROTEIN UA: NEGATIVE mg/dL
RBC UA: NEGATIVE
SPECIFIC GRAVITY, URINE: 1.015
UUROB: NEGATIVE
pH, UA: 7.5 (ref 5.0–8.0)

## 2018-05-13 NOTE — Patient Instructions (Signed)

## 2018-05-14 LAB — CBC WITH DIFFERENTIAL/PLATELET
Basophils Absolute: 0 10*3/uL (ref 0.0–0.2)
Basos: 0 %
EOS (ABSOLUTE): 0.3 10*3/uL (ref 0.0–0.4)
Eos: 4 %
HEMATOCRIT: 40.9 % (ref 34.0–46.6)
Hemoglobin: 13.9 g/dL (ref 11.1–15.9)
Immature Grans (Abs): 0 10*3/uL (ref 0.0–0.1)
Immature Granulocytes: 0 %
LYMPHS ABS: 1.7 10*3/uL (ref 0.7–3.1)
Lymphs: 24 %
MCH: 32.5 pg (ref 26.6–33.0)
MCHC: 34 g/dL (ref 31.5–35.7)
MCV: 96 fL (ref 79–97)
MONOS ABS: 0.5 10*3/uL (ref 0.1–0.9)
Monocytes: 7 %
Neutrophils Absolute: 4.5 10*3/uL (ref 1.4–7.0)
Neutrophils: 65 %
Platelets: 223 10*3/uL (ref 150–450)
RBC: 4.28 x10E6/uL (ref 3.77–5.28)
RDW: 13 % (ref 12.3–15.4)
WBC: 7 10*3/uL (ref 3.4–10.8)

## 2018-05-14 LAB — COMPREHENSIVE METABOLIC PANEL
A/G RATIO: 1.8 (ref 1.2–2.2)
ALK PHOS: 66 IU/L (ref 39–117)
ALT: 12 IU/L (ref 0–32)
AST: 12 IU/L (ref 0–40)
Albumin: 4.2 g/dL (ref 3.5–5.5)
BILIRUBIN TOTAL: 0.3 mg/dL (ref 0.0–1.2)
BUN/Creatinine Ratio: 12 (ref 9–23)
BUN: 9 mg/dL (ref 6–20)
CHLORIDE: 105 mmol/L (ref 96–106)
CO2: 23 mmol/L (ref 20–29)
CREATININE: 0.73 mg/dL (ref 0.57–1.00)
Calcium: 9.3 mg/dL (ref 8.7–10.2)
GFR calc Af Amer: 121 mL/min/{1.73_m2} (ref >59)
GFR calc non Af Amer: 105 mL/min/{1.73_m2} (ref >59)
GLOBULIN, TOTAL: 2.3 g/dL (ref 1.5–4.5)
Glucose: 109 mg/dL — ABNORMAL HIGH (ref 65–99)
POTASSIUM: 4.7 mmol/L (ref 3.5–5.2)
SODIUM: 141 mmol/L (ref 134–144)
Total Protein: 6.5 g/dL (ref 6.0–8.5)

## 2018-05-14 LAB — LIPID PANEL
CHOL/HDL RATIO: 2.9 ratio (ref 0.0–4.4)
Cholesterol, Total: 167 mg/dL (ref 100–199)
HDL: 58 mg/dL (ref >39)
LDL Calculated: 86 mg/dL (ref 0–99)
Triglycerides: 114 mg/dL (ref 0–149)
VLDL Cholesterol Cal: 23 mg/dL (ref 5–40)

## 2018-05-14 LAB — TSH: TSH: 1.95 u[IU]/mL (ref 0.450–4.500)

## 2018-05-14 LAB — T4, FREE: Free T4: 0.97 ng/dL (ref 0.82–1.77)

## 2018-05-16 LAB — HGB A1C W/O EAG: HEMOGLOBIN A1C: 5.3 % (ref 4.8–5.6)

## 2018-05-16 LAB — SPECIMEN STATUS REPORT

## 2018-05-17 IMAGING — US US MFM OB DETAIL+14 WK
1 series · 13 of 28 positions shown · non-contrast
Comparison: none

[Series 1: us mfm ob detail+14 wk · 13 of 114 slices shown]
[im 5/114]
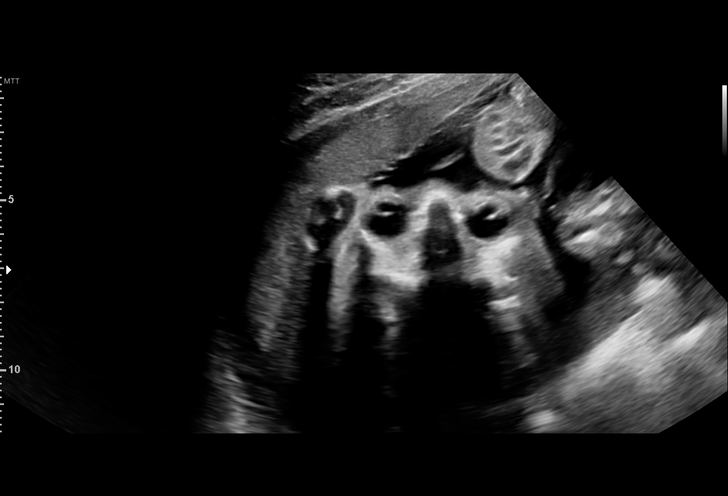
[im 13/114]
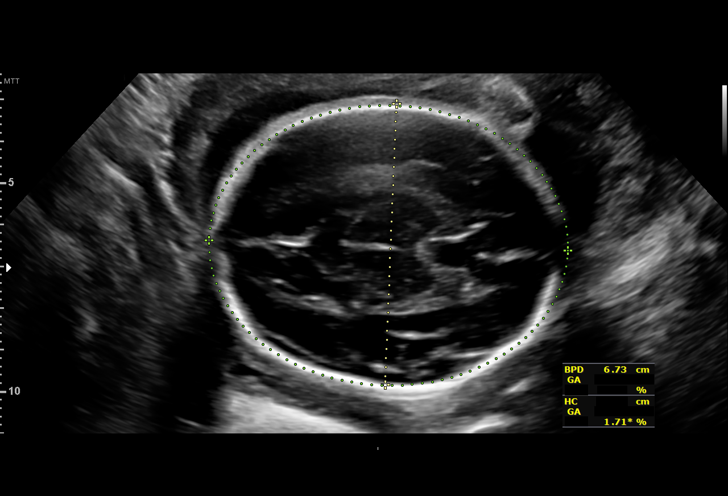
[im 21/114]
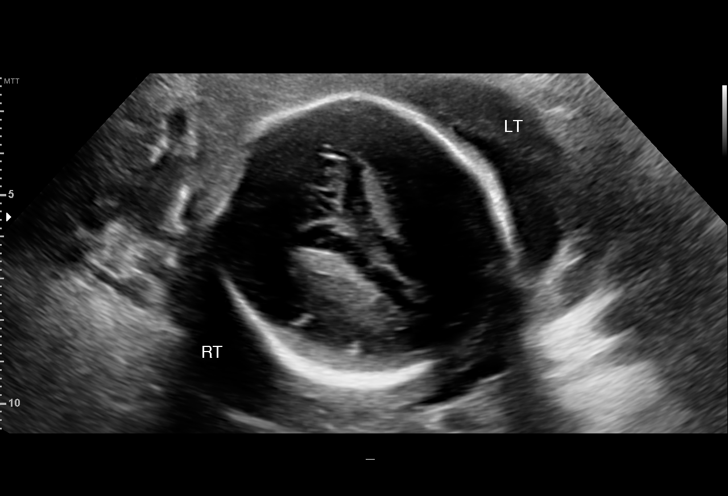
[im 30/114]
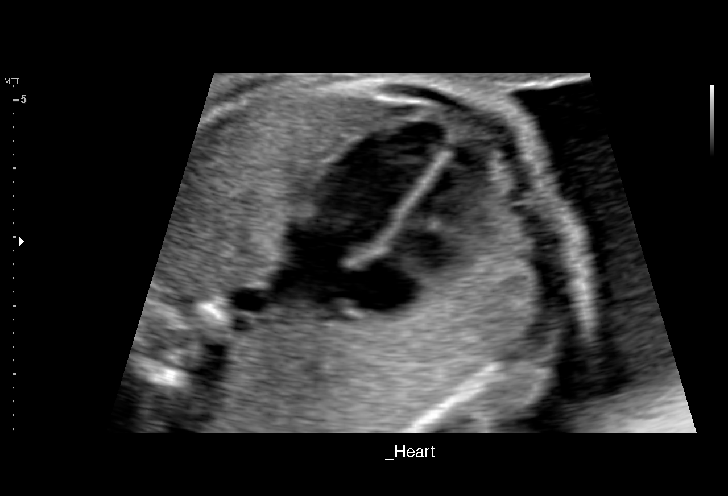
[im 38/114]
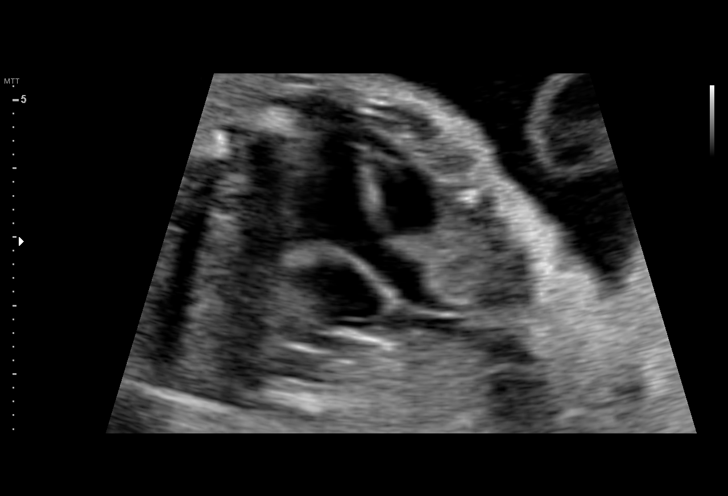
[im 47/114]
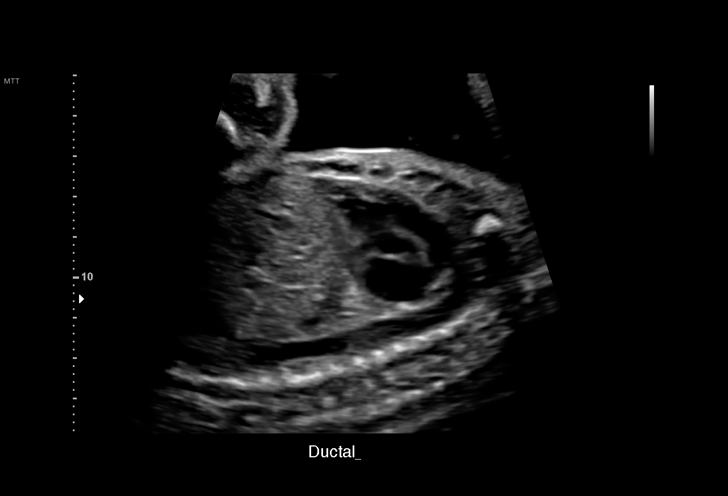
[im 59/114]
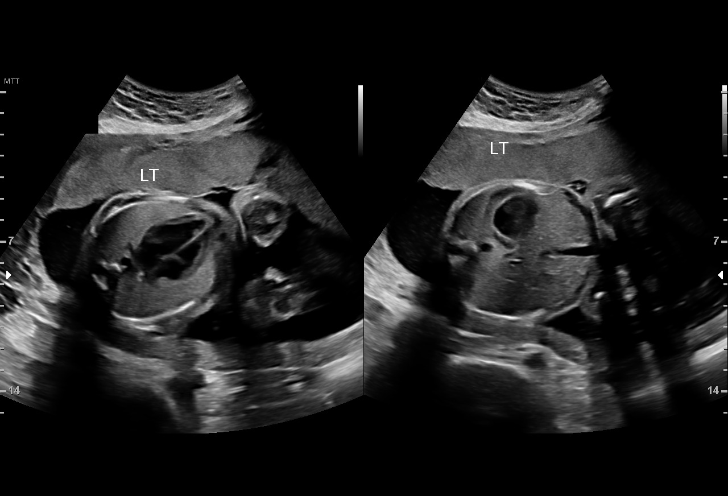
[im 67/114]
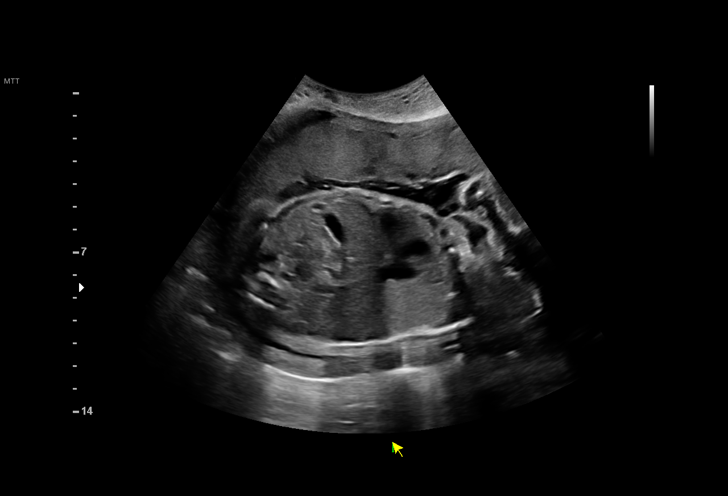
[im 76/114]
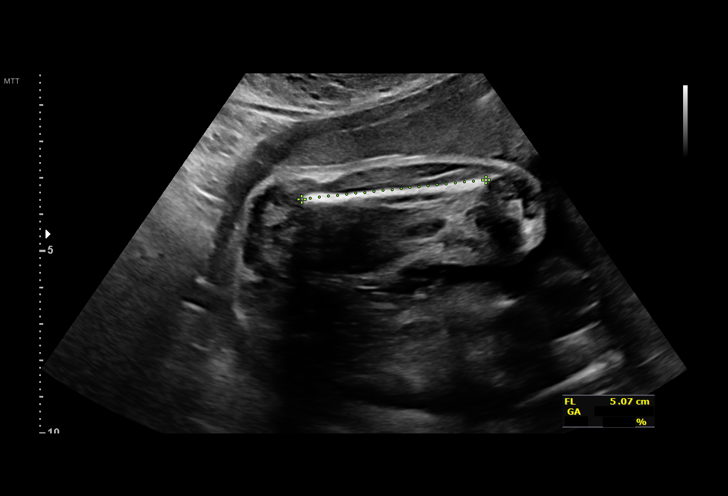
[im 84/114]
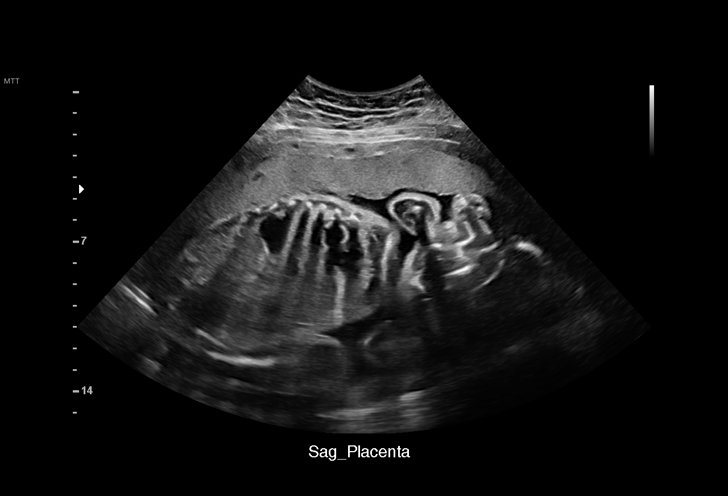
[im 93/114]
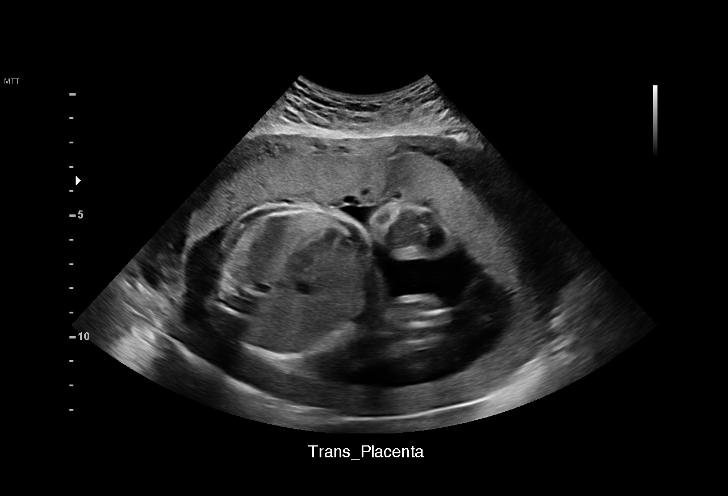
[im 101/114]
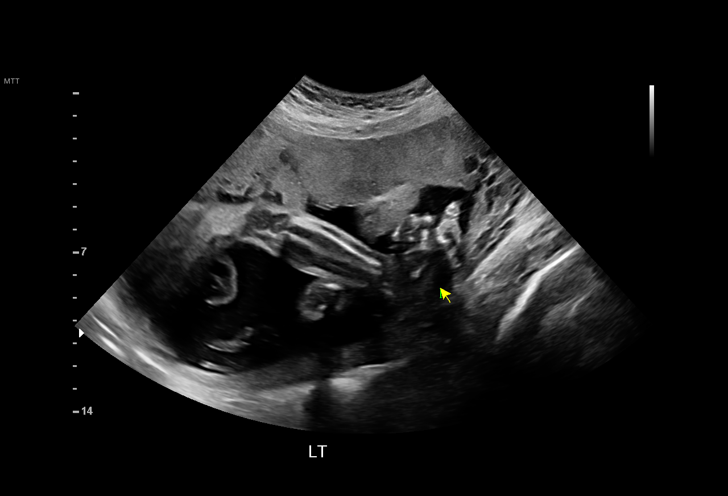
[im 109/114]
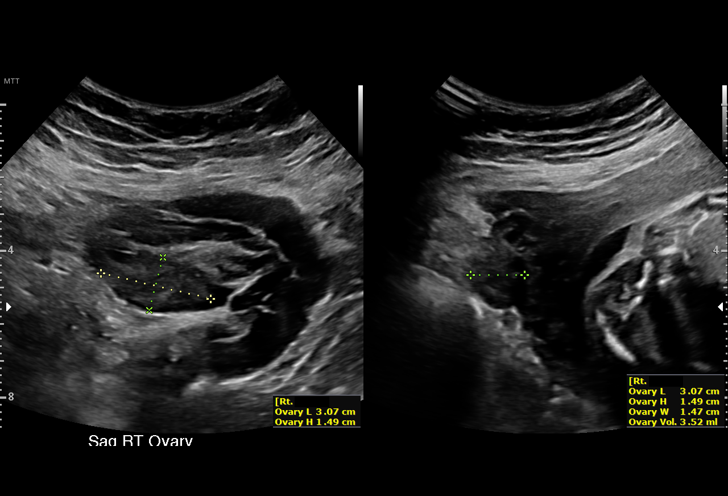

[13 of 28 positions shown; findings below may reference images not displayed]

OB/Gyn
OB/Gyn and
Infertility
AYUMI DO
Ref. Address:     [REDACTED]

[address]          [id number]      [id number]     [id number]
Indications

27 weeks gestation of pregnancy
Encounter for antenatal screening for
malformations
Poor obstetric history: Previous fetal growth
restriction (FGR)
Uterine size-date discrepancy, second
trimester
Advanced maternal age multigravida 35+,
second trimester
OB History

Blood Type:            Height:  5'4"   Weight (lb):  163       BMI:
Fetal Evaluation

Num Of Fetuses:     1
Fetal Heart         137
Rate(bpm):
Cardiac Activity:   Observed
Presentation:       Cephalic
Placenta:           Anterior, above cervical os
P. Cord Insertion:  Visualized, central
Amniotic Fluid
AFI FV:      Subjectively within normal limits

Largest Pocket(cm)
4.8
Biometry

BPD:      66.9  mm     G. Age:  27w 0d         16  %    CI:        76.29   %    70 - 86
FL/HC:      20.8   %    18.8 -
HC:      242.7  mm     G. Age:  26w 3d        < 3  %    HC/AC:      1.04        1.05 -
AC:      234.1  mm     G. Age:  27w 5d         43  %    FL/BPD:     75.6   %    71 - 87
FL:       50.6  mm     G. Age:  27w 1d         21  %    FL/AC:      21.6   %    20 - 24
HUM:      44.2  mm     G. Age:  26w 2d         14  %
CER:      30.3  mm     G. Age:  26w 5d         31  %

CM:        5.3  mm

Est. FW:    1764  gm      2 lb 5 oz     44  %
Gestational Age

LMP:           27w 5d        Date:  05/18/16                 EDD:   02/22/17
U/S Today:     27w 1d                                        EDD:   02/26/17
Best:          27w 5d     Det. By:  LMP  (05/18/16)          EDD:   02/22/17
Anatomy

Cranium:               Appears normal         Aortic Arch:            Appears normal
Cavum:                 Appears normal         Ductal Arch:            Appears normal
Ventricles:            Appears normal         Diaphragm:              Appears normal
Choroid Plexus:        Appears normal         Stomach:                Appears normal, left
sided
Cerebellum:            Appears normal         Abdomen:                Appears normal
Posterior Fossa:       Appears normal         Abdominal Wall:         Appears nml (cord
insert, abd wall)
Nuchal Fold:           Not applicable (>20    Cord Vessels:           Appears normal (3
wks GA)                                        vessel cord)
Face:                  Appears normal         Kidneys:                Appear normal
(orbits and profile)
Lips:                  Not well visualized    Bladder:                Appears normal
Thoracic:              Appears normal         Spine:                  Appears normal
Heart:                 Appears normal         Upper Extremities:      Visualized
(4CH, axis, and situs
RVOT:                  Appears normal         Lower Extremities:      Visualized
LVOT:                  Appears normal

Other:  Fetus appears to be a female. Nasal bone visualized. Technically
difficult due to advanced GA and fetal position.
Cervix Uterus Adnexa

Cervix
Length:           3.03  cm.
Normal appearance by transabdominal scan.

Uterus
No abnormality visualized.

Left Ovary
Size(cm)       2.3  x   2.23   x  1.31      Vol(ml):
Within normal limits. No adnexal mass visualized.
Right Ovary
Size(cm)     3.07   x   1.47   x  1.49      Vol(ml):
Within normal limits. No adnexal mass visualized.

Cul De Sac:   No free fluid seen.

Adnexa:       No abnormality visualized.
Impression

Singleton intrauterine pregnancy at 27+5 weeks, referred for
suspicion for microcephaly
Review of the anatomy shows no sonographic markers for
aneuploidy or structural anomalies
Amniotic fluid volume is normal
Estimated fetal weight is 1062g which is growth in the 44th
percentile. BPD is in the 16th percentile, HC calculates [DATE] percentile
Recommendations

See Consult

## 2019-05-14 NOTE — Patient Instructions (Addendum)
Call and schedule with your OB/GYN and with [facility].     Preventive Care 22-39 Years Old, Female Preventive care refers to visits with your health care provider and lifestyle choices that can promote health and wellness. This includes:  A yearly physical exam. This may also be called an annual well check.  Regular dental visits and eye exams.  Immunizations.  Screening for certain conditions.  Healthy lifestyle choices, such as eating a healthy diet, getting regular exercise, not using drugs or products that contain nicotine and tobacco, and limiting alcohol use. What can I expect for my preventive care visit? Physical exam Your health care provider will check your:  Height and weight. This may be used to calculate body mass index (BMI), which tells if you are at a healthy weight.  Heart rate and blood pressure.  Skin for abnormal spots. Counseling Your health care provider may ask you questions about your:  Alcohol, tobacco, and drug use.  Emotional well-being.  Home and relationship well-being.  Sexual activity.  Eating habits.  Work and work Statistician.  Method of birth control.  Menstrual cycle.  Pregnancy history. What immunizations do I need?  Influenza (flu) vaccine  This is recommended every year. Tetanus, diphtheria, and pertussis (Tdap) vaccine  You may need a Td booster every 10 years. Varicella (chickenpox) vaccine  You may need this if you have not been vaccinated. Human papillomavirus (HPV) vaccine  If recommended by your health care provider, you may need three doses over 6 months. Measles, mumps, and rubella (MMR) vaccine  You may need at least one dose of MMR. You may also need a second dose. Meningococcal conjugate (MenACWY) vaccine  One dose is recommended if you are age 37-21 years and a first-year college student living in a residence hall, or if you have one of several medical conditions. You may also need additional booster  doses. Pneumococcal conjugate (PCV13) vaccine  You may need this if you have certain conditions and were not previously vaccinated. Pneumococcal polysaccharide (PPSV23) vaccine  You may need one or two doses if you smoke cigarettes or if you have certain conditions. Hepatitis A vaccine  You may need this if you have certain conditions or if you travel or work in places where you may be exposed to hepatitis A. Hepatitis B vaccine  You may need this if you have certain conditions or if you travel or work in places where you may be exposed to hepatitis B. Haemophilus influenzae type b (Hib) vaccine  You may need this if you have certain conditions. You may receive vaccines as individual doses or as more than one vaccine together in one shot (combination vaccines). Talk with your health care provider about the risks and benefits of combination vaccines. What tests do I need?  Blood tests  Lipid and cholesterol levels. These may be checked every 5 years starting at age 83.  Hepatitis C test.  Hepatitis B test. Screening  Diabetes screening. This is done by checking your blood sugar (glucose) after you have not eaten for a while (fasting).  Sexually transmitted disease (STD) testing.  BRCA-related cancer screening. This may be done if you have a family history of breast, ovarian, tubal, or peritoneal cancers.  Pelvic exam and Pap test. This may be done every 3 years starting at age 44. Starting at age 24, this may be done every 5 years if you have a Pap test in combination with an HPV test. Talk with your health care provider  about your test results, treatment options, and if necessary, the need for more tests. Follow these instructions at home: Eating and drinking   Eat a diet that includes fresh fruits and vegetables, whole grains, lean protein, and low-fat dairy.  Take vitamin and mineral supplements as recommended by your health care provider.  Do not drink alcohol if: ?  Your health care provider tells you not to drink. ? You are pregnant, may be pregnant, or are planning to become pregnant.  If you drink alcohol: ? Limit how much you have to 0-1 drink a day. ? Be aware of how much alcohol is in your drink. In the [place] one drink equals one 12 oz bottle of beer (355 mL), one 5 oz glass of wine (148 mL), or one 1 oz glass of hard liquor (44 mL). Lifestyle  Take daily care of your teeth and gums.  Stay active. Exercise for at least 30 minutes on 5 or more days each week.  Do not use any products that contain nicotine or tobacco, such as cigarettes, e-cigarettes, and chewing tobacco. If you need help quitting, ask your health care provider.  If you are sexually active, practice safe sex. Use a condom or other form of birth control (contraception) in order to prevent pregnancy and STIs (sexually transmitted infections). If you plan to become pregnant, see your health care provider for a preconception visit. What's next?  Visit your health care provider once a year for a well check visit.  Ask your health care provider how often you should have your eyes and teeth checked.  Stay up to date on all vaccines. This information is not intended to replace advice given to you by your health care provider. Make sure you discuss any questions you have with your health care provider. Document Released: 11/14/2001 Document Revised: 05/30/2018 Document Reviewed: 05/30/2018 Elsevier Patient Education  2020 [employer].

## 2019-05-14 NOTE — Progress Notes (Signed)
Subjective:    Patient ID: Judy Shaw, female    DOB: [birth year], 38 y.o.   MRN: [id number]  HPI Chief Complaint  Patient presents with  . Annual Exam    see's OBGYN    She is here for a complete physical exam. She has other concerns today as well.  Last CPE: last year   Other providers: Dr. Claiborne Billingsallahan at [facility]   States she went to [facility] and was diagnosed with ADHD. She was prescribed Adderall XR.  States she can not afford to continue to go the visits there because it is out of her price range.  States she was started on 20 mg of Adderall XR and she could not sleep. She was then decreased to 15 mg XR and did fine.   Rectal bleeding- intermittent over the past few years, unchanged. Improves when she keeps her stools soft.  Will follow up with GI.   Social history: Lives with husband and 2 children, works as the Public house managerDean of [employer]  moking, drinking alcohol, drug use  Diet: well balanced and healthy  Excerise: 7 days per week   Immunizations: Tdap UTD   Health maintenance:  Mammogram: never  [place] For fissures and hemorrhoids. Polyps were found but no follow up.  Last Gynecological Exam: 2 years ago  Last Menstrual cycle: 2 weeks ago  Last Dental Exam: overdue  Last Eye Exam: 2 years ago   Wears seatbelt always, uses sunscreen, smoke detectors in home and functioning, does not text while driving and feels safe in home environment.   Reviewed allergies, medications, past medical, surgical, family, and social history.     Review of Systems Review of Systems Constitutional: -fever, -chills, -sweats, -unexpected weight change,-fatigue ENT: -runny nose, -ear pain, -sore throat Cardiology:  -chest pain, -palpitations, -edema Respiratory: -cough, -shortness of breath, -wheezing Gastroenterology: -abdominal pain, -nausea, -vomiting, -diarrhea, -constipation  Hematology: -bleeding or  bruising problems Musculoskeletal: -arthralgias, -myalgias, -joint swelling, -back pain Ophthalmology: -vision changes Urology: -dysuria, -difficulty urinating, -hematuria, -urinary frequency, -urgency Neurology: -headache, -weakness, -tingling, -numbness       Objective:   Physical Exam BP 122/78   Pulse 87   Temp 98.6 F (37 C) (Oral)   Ht 5\' 3"  (1.6 m)   Wt 155 lb 12.8 oz (70.7 kg)   SpO2 98%   BMI 27.60 kg/m   General Appearance:    Alert, cooperative, no distress, appears stated age  Head:    Normocephalic, without obvious abnormality, atraumatic  Eyes:    PERRL, conjunctiva/corneas clear, EOM's intact, fundi    benign  Ears:    Normal TM's and external ear canals  Nose:   Nares normal, mucosa normal, no drainage or sinus   tenderness  Throat:   Lips, mucosa, and tongue normal; teeth and gums normal  Neck:   Supple, no lymphadenopathy;  thyroid:  no   enlargement/tenderness/nodules; no carotid   bruit or JVD  Back:    Spine nontender, no curvature, ROM normal, no CVA     tenderness  Lungs:     Clear to auscultation bilaterally without wheezes, rales or     ronchi; respirations unlabored  Chest Wall:    No tenderness or deformity   Heart:    Regular rate and rhythm, S1 and S2 normal, no murmur, rub   or gallop  Breast Exam:    OB/GYN  Abdomen:     Soft, non-tender, nondistended, normoactive  bowel sounds,    no masses, no hepatosplenomegaly  Genitalia:    OB/GYN     Extremities:   No clubbing, cyanosis or edema  Pulses:   2+ and symmetric all extremities  Skin:   Skin color, texture, turgor normal, no rashes or lesions  Lymph nodes:   Cervical, supraclavicular, and axillary nodes normal  Neurologic:   CNII-XII intact, normal strength, sensation and gait; reflexes 2+ and symmetric throughout          Psych:   Normal mood, affect, hygiene and grooming.         Assessment & Plan:  Routine general medical examination at a health care facility - Plan: CBC with  Differential/Platelet, Comprehensive metabolic panel, TSH, T3, T4, free, Lipid panel, give today for fasting CPE.  Discussed preventive health including screenings, immunizations and safety.  She will follow-up with her OB/GYN for breast and pelvic exams.  She does have a form for her job but I will fill out once I have the lab results.  Attention deficit hyperactivity disorder (ADHD), unspecified ADHD type - Plan: States going to [facility] for medication is no longer affordable.  I will request records from them and then be in touch with the patient.  Discussed that I would try to work with her in regards to finding the right dose of medication to help her.   Rectal bleeding - Plan: intermittent. Not active. Follow up with GI  Family history of rectal cancer - Plan: follow up with GI

## 2019-05-15 ENCOUNTER — Other Ambulatory Visit: Payer: Self-pay

## 2019-05-15 ENCOUNTER — Encounter: Payer: Self-pay | Admitting: Family Medicine

## 2019-05-15 ENCOUNTER — Ambulatory Visit (INDEPENDENT_AMBULATORY_CARE_PROVIDER_SITE_OTHER): Payer: Commercial Managed Care - PPO | Admitting: Family Medicine

## 2019-05-15 VITALS — BP 122/78 | HR 87 | Temp 98.6°F | Ht 63.0 in | Wt 155.8 lb

## 2019-05-15 DIAGNOSIS — F909 Attention-deficit hyperactivity disorder, unspecified type: Secondary | ICD-10-CM

## 2019-05-15 DIAGNOSIS — Z0000 Encounter for general adult medical examination without abnormal findings: Secondary | ICD-10-CM | POA: Diagnosis not present

## 2019-05-15 DIAGNOSIS — K625 Hemorrhage of anus and rectum: Secondary | ICD-10-CM | POA: Diagnosis not present

## 2019-05-15 DIAGNOSIS — Z800 Family history of malignant neoplasm of digestive organs: Secondary | ICD-10-CM

## 2019-05-16 LAB — CBC WITH DIFFERENTIAL/PLATELET
Basophils Absolute: 0 10*3/uL (ref 0.0–0.2)
Basos: 0 %
EOS (ABSOLUTE): 0.2 10*3/uL (ref 0.0–0.4)
Eos: 2 %
Hematocrit: 43.8 % (ref 34.0–46.6)
Hemoglobin: 14.8 g/dL (ref 11.1–15.9)
Immature Grans (Abs): 0 10*3/uL (ref 0.0–0.1)
Immature Granulocytes: 0 %
Lymphocytes Absolute: 1.3 10*3/uL (ref 0.7–3.1)
Lymphs: 15 %
MCH: 33 pg (ref 26.6–33.0)
MCHC: 33.8 g/dL (ref 31.5–35.7)
MCV: 98 fL — ABNORMAL HIGH (ref 79–97)
Monocytes Absolute: 0.6 10*3/uL (ref 0.1–0.9)
Monocytes: 7 %
Neutrophils Absolute: 7 10*3/uL (ref 1.4–7.0)
Neutrophils: 76 %
Platelets: 212 10*3/uL (ref 150–450)
RBC: 4.49 x10E6/uL (ref 3.77–5.28)
RDW: 11.8 % (ref 11.7–15.4)
WBC: 9.2 10*3/uL (ref 3.4–10.8)

## 2019-05-16 LAB — T4, FREE: Free T4: 1.07 ng/dL (ref 0.82–1.77)

## 2019-05-16 LAB — LIPID PANEL
Chol/HDL Ratio: 2.9 ratio (ref 0.0–4.4)
Cholesterol, Total: 168 mg/dL (ref 100–199)
HDL: 57 mg/dL (ref >39)
LDL Calculated: 92 mg/dL (ref 0–99)
Triglycerides: 95 mg/dL (ref 0–149)
VLDL Cholesterol Cal: 19 mg/dL (ref 5–40)

## 2019-05-16 LAB — COMPREHENSIVE METABOLIC PANEL
ALT: 14 IU/L (ref 0–32)
AST: 15 IU/L (ref 0–40)
Albumin/Globulin Ratio: 2.1 (ref 1.2–2.2)
Albumin: 4.7 g/dL (ref 3.8–4.8)
Alkaline Phosphatase: 70 IU/L (ref 39–117)
BUN/Creatinine Ratio: 14 (ref 9–23)
BUN: 11 mg/dL (ref 6–20)
Bilirubin Total: 0.6 mg/dL (ref 0.0–1.2)
CO2: 21 mmol/L (ref 20–29)
Calcium: 9.4 mg/dL (ref 8.7–10.2)
Chloride: 103 mmol/L (ref 96–106)
Creatinine, Ser: 0.76 mg/dL (ref 0.57–1.00)
GFR calc Af Amer: 114 mL/min/{1.73_m2} (ref >59)
GFR calc non Af Amer: 99 mL/min/{1.73_m2} (ref >59)
Globulin, Total: 2.2 g/dL (ref 1.5–4.5)
Glucose: 109 mg/dL — ABNORMAL HIGH (ref 65–99)
Potassium: 4.3 mmol/L (ref 3.5–5.2)
Sodium: 139 mmol/L (ref 134–144)
Total Protein: 6.9 g/dL (ref 6.0–8.5)

## 2019-05-16 LAB — TSH: TSH: 3.25 u[IU]/mL (ref 0.450–4.500)

## 2019-05-16 LAB — T3: T3, Total: 136 ng/dL (ref 71–180)

## 2019-05-21 ENCOUNTER — Telehealth: Payer: Self-pay | Admitting: Family Medicine

## 2019-05-21 NOTE — Telephone Encounter (Signed)
Received requested records from [facility].

## 2019-05-22 ENCOUNTER — Encounter: Payer: Self-pay | Admitting: Family Medicine

## 2019-05-22 ENCOUNTER — Telehealth: Payer: Self-pay | Admitting: Internal Medicine

## 2019-05-22 DIAGNOSIS — F900 Attention-deficit hyperactivity disorder, predominantly inattentive type: Secondary | ICD-10-CM

## 2019-05-22 DIAGNOSIS — F4010 Social phobia, unspecified: Secondary | ICD-10-CM

## 2019-05-22 HISTORY — DX: Attention-deficit hyperactivity disorder, predominantly inattentive type: F90.0

## 2019-05-22 HISTORY — DX: Social phobia, unspecified: F40.10

## 2019-05-22 NOTE — Telephone Encounter (Signed)
If pt wants vickie to take over ADHD since we have records, then ok to do a virtual visit to discuss this

## 2019-05-26 ENCOUNTER — Telehealth: Payer: Self-pay | Admitting: Family Medicine

## 2019-05-26 NOTE — Telephone Encounter (Signed)
Received requested records from [facility] 

## 2019-05-27 ENCOUNTER — Encounter: Payer: Self-pay | Admitting: Family Medicine

## 2019-07-09 ENCOUNTER — Other Ambulatory Visit: Payer: Self-pay

## 2019-07-09 ENCOUNTER — Encounter: Payer: Self-pay | Admitting: Family Medicine

## 2019-07-09 ENCOUNTER — Ambulatory Visit (INDEPENDENT_AMBULATORY_CARE_PROVIDER_SITE_OTHER): Payer: Commercial Managed Care - PPO | Admitting: Family Medicine

## 2019-07-09 VITALS — Temp 97.8°F | Wt 155.0 lb

## 2019-07-09 DIAGNOSIS — R0982 Postnasal drip: Secondary | ICD-10-CM

## 2019-07-09 DIAGNOSIS — R519 Headache, unspecified: Secondary | ICD-10-CM | POA: Diagnosis not present

## 2019-07-09 NOTE — Progress Notes (Signed)
   Subjective:  Documentation for virtual audio and video telecommunications through [place] encounter:  The patient was located at home. 2 patient identifiers used.  The provider was located in the office. The patient did consent to this visit and is aware of possible charges through their insurance for this visit.  The other persons participating in this telemedicine service were none.    Patient ID: Judy Shaw, female    DOB: 17-Apr-1981, 38 y.o.   MRN: [id number]  HPI Chief Complaint  Patient presents with  . sinus infection    started yesterday, nasal passage, headache, no fever. have not taken anythign for this   Complains of a 24 hour history of post nasal drainage, right sided facial pain and fatigue.  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   Denies fever, chills, dizziness, headache, ear pain, rhinorrhea, sore throat, cough, chest pain, shortness of breath, abdominal pain,  N/V/D.   Reviewed allergies, medications, past medical, surgical, family, and social history.   Review of Systems Pertinent positives and negatives in the history of present illness.     Objective:   Physical Exam Temp 97.8 F (36.6 C)   Wt 155 lb (70.3 kg)   BMI 27.46 kg/m   Alert and oriented and in no acute distress. Respirations unlabored.       Assessment & Plan:  Post-nasal drainage  Right sided facial pain  No known Covid-19 exposure but she does work in a school.  Will treat her symptoms for now with Tylenol, Sudafed, hydration and rest.  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 If she develops fever or worsening symptoms, she will get Covid-19 test.  Follow up as needed.   Time spent on call was 15 minutes and in review of previous records 2 minutes total.  This virtual service is not related to other E/M service within previous 7 days.

## 2019-07-15 ENCOUNTER — Telehealth: Payer: Self-pay | Admitting: Family Medicine

## 2019-07-15 NOTE — Telephone Encounter (Signed)
This is correct. It is recommended that asymptomatic people with known exposure to Covid-19 quarantine and get tested around day 5 for best accuracy of the test. Thanks.

## 2019-07-15 NOTE — Telephone Encounter (Signed)
Pt. 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 Of distance and both had masks on. I informed pt. If she has no symptoms she does not need to be tested until after 4-5 days and to keep distance from family members and wear a mask in her house.

## 2019-07-16 NOTE — Telephone Encounter (Signed)
Pt was notified of results she has been isolating herself from the rest of the family and will be getting tested today

## 2019-07-16 NOTE — Telephone Encounter (Signed)
Tried to call pt but mailbox full.

## 2019-07-17 ENCOUNTER — Other Ambulatory Visit: Payer: Self-pay

## 2019-07-17 DIAGNOSIS — Z20822 Contact with and (suspected) exposure to covid-19: Secondary | ICD-10-CM

## 2019-07-19 LAB — NOVEL CORONAVIRUS, NAA: SARS-CoV-2, NAA: NOT DETECTED

## 2019-11-14 ENCOUNTER — Ambulatory Visit: Payer: Commercial Managed Care - PPO

## 2019-11-27 ENCOUNTER — Ambulatory Visit: Payer: Commercial Managed Care - PPO

## 2019-12-07 ENCOUNTER — Encounter: Payer: Self-pay | Admitting: Family Medicine

## 2019-12-09 ENCOUNTER — Other Ambulatory Visit: Payer: Self-pay

## 2019-12-09 ENCOUNTER — Encounter: Payer: Self-pay | Admitting: Family Medicine

## 2019-12-09 ENCOUNTER — Ambulatory Visit (INDEPENDENT_AMBULATORY_CARE_PROVIDER_SITE_OTHER): Payer: Commercial Managed Care - PPO | Admitting: Family Medicine

## 2019-12-09 VITALS — BP 114/68 | HR 79 | Temp 97.7°F | Wt 170.0 lb

## 2019-12-09 DIAGNOSIS — M461 Sacroiliitis, not elsewhere classified: Secondary | ICD-10-CM

## 2019-12-09 NOTE — Progress Notes (Signed)
   Subjective:    Patient ID: Judy Shaw, female    DOB: October 06, 1980, 39 y.o.   MRN: [id number]  HPI She is here for evaluation of 2 episodes of left-sided low back pain.  Both occurred after she bent and twisted doing some minor activity.  She states it did radiate down the left lateral side of her thigh but did go away after 2 hours.  No numbness tingling or weakness.  Review of Systems     Objective:   Physical Exam Alert and in no distress.  Normal lumbar curve and motion.  Slight discomfort over left SI joint.  FABER test was positive.  Hip motion normal.  Negative straight leg raising.      Assessment & Plan:  Sacroiliitis (HCC) I explained the diagnosis and therapy for this being heat, stretching and anti-inflammatory.  Demonstrated stretching techniques that she can do.  She will call if she has further difficulty.

## 2020-02-17 ENCOUNTER — Other Ambulatory Visit: Payer: Self-pay

## 2020-02-17 ENCOUNTER — Ambulatory Visit (INDEPENDENT_AMBULATORY_CARE_PROVIDER_SITE_OTHER): Payer: Commercial Managed Care - PPO | Admitting: Family Medicine

## 2020-02-17 ENCOUNTER — Encounter: Payer: Self-pay | Admitting: Family Medicine

## 2020-02-17 VITALS — BP 110/68 | HR 88 | Temp 98.2°F | Wt 167.4 lb

## 2020-02-17 DIAGNOSIS — N3000 Acute cystitis without hematuria: Secondary | ICD-10-CM

## 2020-02-17 LAB — POCT URINALYSIS DIP (PROADVANTAGE DEVICE)
Blood, UA: NEGATIVE
Glucose, UA: NEGATIVE mg/dL
Ketones, POC UA: NEGATIVE mg/dL
Nitrite, UA: POSITIVE — AB
Protein Ur, POC: NEGATIVE mg/dL
Specific Gravity, Urine: 1.01
pH, UA: 7 (ref 5.0–8.0)

## 2020-02-17 MED ORDER — SULFAMETHOXAZOLE-TRIMETHOPRIM 800-160 MG PO TABS: 1.0000 | ORAL_TABLET | Freq: Two times a day (BID) | ORAL | 0 refills | Status: DC

## 2020-02-17 NOTE — Addendum Note (Signed)
Addended by: Renelda Loma on: 02/17/2020 03:57 PM   Modules accepted: Orders

## 2020-02-17 NOTE — Progress Notes (Signed)
   Subjective:    Patient ID: Judy Shaw, female    DOB: 1981-03-02, 39 y.o.   MRN: [id number]  HPI She complains of a 1 day history of dysuria and abnormal urine odor.  She did take Azo standard which she has done in the past.  She has a previous history of difficulty with this but her last UTI was several years ago.  No fever or chills.   Review of Systems     Objective:   Physical Exam Alert and in no distress otherwise not examined      Assessment & Plan:  Acute cystitis without hematuria - Plan: sulfamethoxazole-trimethoprim (BACTRIM DS) 800-160 MG tablet I will treated with a 3-day course of the antibiotic and see if that will be enough.  She will keep [place] informed as to whether we are successful or not.

## 2020-04-23 DIAGNOSIS — R638 Other symptoms and signs concerning food and fluid intake: Secondary | ICD-10-CM

## 2020-04-23 HISTORY — DX: Other symptoms and signs concerning food and fluid intake: R63.8

## 2020-05-02 LAB — RESULTS CONSOLE HPV: CHL HPV: NEGATIVE

## 2020-05-02 LAB — HM PAP SMEAR: HM Pap smear: NEGATIVE

## 2020-05-17 ENCOUNTER — Encounter: Payer: Commercial Managed Care - PPO | Admitting: Family Medicine

## 2020-06-24 ENCOUNTER — Ambulatory Visit (INDEPENDENT_AMBULATORY_CARE_PROVIDER_SITE_OTHER): Payer: Commercial Managed Care - PPO | Admitting: Family Medicine

## 2020-06-24 ENCOUNTER — Encounter: Payer: Self-pay | Admitting: Family Medicine

## 2020-06-24 ENCOUNTER — Other Ambulatory Visit: Payer: Self-pay

## 2020-06-24 VITALS — BP 120/62 | HR 55 | Ht 63.5 in | Wt 164.0 lb

## 2020-06-24 DIAGNOSIS — M79671 Pain in right foot: Secondary | ICD-10-CM

## 2020-06-24 DIAGNOSIS — R200 Anesthesia of skin: Secondary | ICD-10-CM

## 2020-06-24 DIAGNOSIS — F900 Attention-deficit hyperactivity disorder, predominantly inattentive type: Secondary | ICD-10-CM | POA: Diagnosis not present

## 2020-06-24 DIAGNOSIS — Z0000 Encounter for general adult medical examination without abnormal findings: Secondary | ICD-10-CM

## 2020-06-24 DIAGNOSIS — Z23 Encounter for immunization: Secondary | ICD-10-CM | POA: Diagnosis not present

## 2020-06-24 NOTE — Patient Instructions (Signed)

## 2020-06-24 NOTE — Progress Notes (Signed)
Subjective:    Patient ID: Judy Shaw, female    DOB: [birth year], 39 y.o.   MRN: [id number]  HPI Chief Complaint  Patient presents with   nonfasting cpe    nonfasting cpe, sees obgyn. flu shot today   She is here for a complete physical exam. She has acute complaints as well.   Other providers: Dr. Claiborne Billings at [facility] in distant past.   Complains of a several year history of right foot numbness. States this occurs when she is walking briskly after a mile typically. States she feels like she is putting her foot down on a pillow.  States this used to only happen when she was jogging in 2014.  No other times.  She also reports a history of right heel pain.   Denies back pain, leg pain. No numbness, tingling or weakness.   She has spot on her thumb that keeps appearing and then she picks at it and it goes away. This has been ongoing for several months. She has not tried anything over the counter.   ADHD- states she has not been taking her medication. She has been using techniques to help with her focus and is doing well.     Social history: Lives withhusband and 2 children, works at [employer] Denies smoking, drinking alcohol, drug use  Diet: fairly healthy  Excerise: foot pain keeps her from doing this as much as she wants   Immunizations: UTD including Covid. Flu shot today   Health maintenance:  Mammogram: this year  Colonoscopy: 2005 in [place]. For fissures and hemorrhoids. Polyps were found but no follow up.  Last Gynecological Exam: summer 2021  Last Menstrual cycle: 2 weeks ago and regular cycles  Last Dental Exam: has appt in December 2021  Last Eye Exam: 3 years ago   Reviewed allergies, medications, past medical, surgical, family, and social history.    Review of Systems Review of Systems Constitutional: -fever, -chills, -sweats, -unexpected weight change,-fatigue ENT: -runny nose, -ear pain, -sore throat Cardiology:   -chest pain, -palpitations, -edema Respiratory: -cough, -shortness of breath, -wheezing Gastroenterology: -abdominal pain, -nausea, -vomiting, -diarrhea, -constipation  Hematology: -bleeding or bruising problems Musculoskeletal: -arthralgias, -myalgias, -joint swelling, -back pain Ophthalmology: -vision changes Urology: -dysuria, -difficulty urinating, -hematuria, -urinary frequency, -urgency Neurology: -headache, -weakness, -tingling, -numbness      Objective:   Physical Exam BP 120/62    Pulse (!) 55    Ht 5' 3.5" (1.613 m)    Wt 164 lb (74.4 kg)    BMI 28.60 kg/m   General Appearance:    Alert, cooperative, no distress, appears stated age  Head:    Normocephalic, without obvious abnormality, atraumatic  Eyes:    PERRL, conjunctiva/corneas clear, EOM's intact  Ears:    Normal TM's and external ear canals  Nose:   Mask on   Throat:   Mask on   Neck:   Supple, no lymphadenopathy;  thyroid:  no   enlargement/tenderness/nodules; no JVD  Back:    Spine nontender, no curvature, ROM normal, no CVA     tenderness  Lungs:     Clear to auscultation bilaterally without wheezes, rales or     ronchi; respirations unlabored  Chest Wall:    No tenderness or deformity   Heart:    Regular rate and rhythm, S1 and S2 normal, no murmur, rub   or gallop  Breast Exam:    OB/GYN    No axillary lymphadenopathy  Abdomen:     Soft, non-tender, nondistended, normoactive bowel sounds,    no masses, no hepatosplenomegaly  Genitalia:    OB/GYN     Extremities:   No clubbing, cyanosis or edema  Pulses:   2+ and symmetric all extremities  Skin:   Skin color, texture, turgor normal, no rashes or lesions  Lymph nodes:   Cervical, supraclavicular, and axillary nodes normal  Neurologic:   CNII-XII intact, normal strength, sensation and gait           Psych:   Normal mood, affect, hygiene and grooming.         Assessment & Plan:  Routine general medical examination at a health care facility -she is here for  a CPE. Non fasting and prefers to wait to have labs until her next visit. Reviewed labs from her previous visit and I am ok with this.  Preventive health reviewed. She sees OB/GYN. UTD with mammogram and pap. Recommend regular dental and eye exams.  Immunizations reviewed. Flu shot given.  She appears to be taking good care of herself and would like to exercise more.   Needs flu shot - Plan: Flu Vaccine QUAD 36+ mos IM  Numbness of right foot - Plan: Ambulatory referral to Podiatry -foot exam unremarkable. No radiculopathy. Refer to podiatry   Pain of right heel - Plan: Ambulatory referral to Podiatry   ADHD (attention deficit hyperactivity disorder), inattentive type -she is managing without medication for now.

## 2020-07-14 ENCOUNTER — Ambulatory Visit (INDEPENDENT_AMBULATORY_CARE_PROVIDER_SITE_OTHER): Payer: Commercial Managed Care - PPO | Admitting: Podiatry

## 2020-07-14 ENCOUNTER — Other Ambulatory Visit: Payer: Self-pay | Admitting: Podiatry

## 2020-07-14 ENCOUNTER — Other Ambulatory Visit: Payer: Self-pay

## 2020-07-14 ENCOUNTER — Encounter: Payer: Self-pay | Admitting: Podiatry

## 2020-07-14 ENCOUNTER — Ambulatory Visit (INDEPENDENT_AMBULATORY_CARE_PROVIDER_SITE_OTHER): Payer: Commercial Managed Care - PPO

## 2020-07-14 DIAGNOSIS — M722 Plantar fascial fibromatosis: Secondary | ICD-10-CM

## 2020-07-14 DIAGNOSIS — M79672 Pain in left foot: Secondary | ICD-10-CM | POA: Diagnosis not present

## 2020-07-14 DIAGNOSIS — M79671 Pain in right foot: Secondary | ICD-10-CM

## 2020-07-14 NOTE — Patient Instructions (Signed)

## 2020-07-15 NOTE — Progress Notes (Signed)
Subjective:   Patient ID: Judy Shaw, female   DOB: 39 y.o.   MRN: [id number]   HPI Patient presents stating that she gets overall tightness in her feet then she develops numbing sensations which seem to be worse right over left foot. Patient states that she has noticed this over the last few years and it does not seem to be getting worse but it seems to be consistent with running or fast walking. Patient does not smoke likes to be active   Review of Systems  All other systems reviewed and are negative.       Objective:  Physical Exam Vitals and nursing note reviewed.  Constitutional:      Appearance: She is well-developed.  Pulmonary:     Effort: Pulmonary effort is normal.  Musculoskeletal:        General: Normal range of motion.  Skin:    General: Skin is warm.  Neurological:     Mental Status: She is alert.     Neurovascular status was found to be intact muscle strength was found to be adequate and I did not note any diminishment sharp dull vibratory. Patient has good structure of her feet I did not note there to be any form of equinus condition currently and I did not note any gait disturbances with ambulation. Patient does not deal with this on an ongoing consistent basis but seems to deal with it more episodic related to activity     Assessment:  Strong probability that this may be more related to some form of back compression or other kind of nerve compression which is creating a stress on a nerve system which is creating this problem.     Plan:  H&P x-rays reviewed. We discussed different exercises for her to do along with possibility for chiropractic care or other care to see whether or not this would make some difference for her. I do not recommend neurological studies currently or other more advanced and I do think this is a relatively low level problem and we just reviewed different ideas of things that she can do to try to mitigate the symptoms. Patient will  be seen back as needed and I also discussed low-dose ibuprofen usage prior to activities and begin aggressive stretching  X-rays were negative for signs of fracture or signs of bony pathology associated with condition

## 2021-02-22 ENCOUNTER — Encounter: Payer: Commercial Managed Care - PPO | Admitting: Family Medicine

## 2021-03-01 ENCOUNTER — Encounter: Payer: Commercial Managed Care - PPO | Admitting: Family Medicine

## 2021-03-11 ENCOUNTER — Other Ambulatory Visit: Payer: Self-pay

## 2021-03-11 ENCOUNTER — Encounter: Payer: Self-pay | Admitting: Family Medicine

## 2021-03-11 ENCOUNTER — Ambulatory Visit (INDEPENDENT_AMBULATORY_CARE_PROVIDER_SITE_OTHER): Payer: Commercial Managed Care - PPO | Admitting: Family Medicine

## 2021-03-11 VITALS — BP 120/72 | HR 68 | Ht 63.5 in | Wt 162.4 lb

## 2021-03-11 DIAGNOSIS — Z0000 Encounter for general adult medical examination without abnormal findings: Secondary | ICD-10-CM

## 2021-03-11 DIAGNOSIS — Z1159 Encounter for screening for other viral diseases: Secondary | ICD-10-CM

## 2021-03-11 DIAGNOSIS — Z8719 Personal history of other diseases of the digestive system: Secondary | ICD-10-CM | POA: Diagnosis not present

## 2021-03-11 DIAGNOSIS — Z800 Family history of malignant neoplasm of digestive organs: Secondary | ICD-10-CM | POA: Diagnosis not present

## 2021-03-11 DIAGNOSIS — K5909 Other constipation: Secondary | ICD-10-CM

## 2021-03-11 DIAGNOSIS — Z86010 Personal history of colonic polyps: Secondary | ICD-10-CM | POA: Diagnosis not present

## 2021-03-11 DIAGNOSIS — Z1329 Encounter for screening for other suspected endocrine disorder: Secondary | ICD-10-CM

## 2021-03-11 DIAGNOSIS — Z13220 Encounter for screening for lipoid disorders: Secondary | ICD-10-CM

## 2021-03-11 NOTE — Patient Instructions (Signed)
Preventive Care 41-41 Years Old, Female Preventive care refers to lifestyle choices and visits with your health care provider that can promote health and wellness. This includes: A yearly physical exam. This is also called an annual wellness visit. Regular dental and eye exams. Immunizations. Screening for certain conditions. Healthy lifestyle choices, such as: Eating a healthy diet. Getting regular exercise. Not using drugs or products that contain nicotine and tobacco. Limiting alcohol use. What can I expect for my preventive care visit? Physical exam Your health care provider will check your: Height and weight. These may be used to calculate your BMI (body mass index). BMI is a measurement that tells if you are at a healthy weight. Heart rate and blood pressure. Body temperature. Skin for abnormal spots. Counseling Your health care provider may ask you questions about your: Past medical problems. Family's medical history. Alcohol, tobacco, and drug use. Emotional well-being. Home life and relationship well-being. Sexual activity. Diet, exercise, and sleep habits. Work and work Statistician. Access to firearms. Method of birth control. Menstrual cycle. Pregnancy history. What immunizations do I need?  Vaccines are usually given at various ages, according to a schedule. Your health care provider will recommend vaccines for you based on your age, medicalhistory, and lifestyle or other factors, such as travel or where you work. What tests do I need? Blood tests Lipid and cholesterol levels. These may be checked every 5 years, or more often if you are over 41 years old. Hepatitis C test. Hepatitis B test. Screening Lung cancer screening. You may have this screening every year starting at age 30 if you have a 30-pack-year history of smoking and currently smoke or have quit within the past 15 years. Colorectal cancer screening. All adults should have this screening starting at  age 23 and continuing until age 3. Your health care provider may recommend screening at age 41 if you are at increased risk. You will have tests every 1-10 years, depending on your results and the type of screening test. Diabetes screening. This is done by checking your blood sugar (glucose) after you have not eaten for a while (fasting). You may have this done every 1-3 years. Mammogram. This may be done every 1-2 years. Talk with your health care provider about when you should start having regular mammograms. This may depend on whether you have a family history of breast cancer. BRCA-related cancer screening. This may be done if you have a family history of breast, ovarian, tubal, or peritoneal cancers. Pelvic exam and Pap test. This may be done every 3 years starting at age 41. Starting at age 41, this may be done every 5 years if you have a Pap test in combination with an HPV test. Other tests STD (sexually transmitted disease) testing, if you are at risk. Bone density scan. This is done to screen for osteoporosis. You may have this scan if you are at high risk for osteoporosis. Talk with your health care provider about your test results, treatment options,and if necessary, the need for more tests. Follow these instructions at home: Eating and drinking  Eat a diet that includes fresh fruits and vegetables, whole grains, lean protein, and low-fat dairy products. Take vitamin and mineral supplements as recommended by your health care provider. Do not drink alcohol if: Your health care provider tells you not to drink. You are pregnant, may be pregnant, or are planning to become pregnant. If you drink alcohol: Limit how much you have to 0-1 drink a day. Be aware  of how much alcohol is in your drink. In the [place] one drink equals one 12 oz bottle of beer (355 mL), one 5 oz glass of wine (148 mL), or one 1 oz glass of hard liquor (44 mL).  Lifestyle Take daily care of your teeth and  gums. Brush your teeth every morning and night with fluoride toothpaste. Floss one time each day. Stay active. Exercise for at least 30 minutes 5 or more days each week. Do not use any products that contain nicotine or tobacco, such as cigarettes, e-cigarettes, and chewing tobacco. If you need help quitting, ask your health care provider. Do not use drugs. If you are sexually active, practice safe sex. Use a condom or other form of protection to prevent STIs (sexually transmitted infections). If you do not wish to become pregnant, use a form of birth control. If you plan to become pregnant, see your health care provider for a prepregnancy visit. If told by your health care provider, take low-dose aspirin daily starting at age 41. Find healthy ways to cope with stress, such as: Meditation, yoga, or listening to music. Journaling. Talking to a trusted person. Spending time with friends and family. Safety Always wear your seat belt while driving or riding in a vehicle. Do not drive: If you have been drinking alcohol. Do not ride with someone who has been drinking. When you are tired or distracted. While texting. Wear a helmet and other protective equipment during sports activities. If you have firearms in your house, make sure you follow all gun safety procedures. What's next? Visit your health care provider once a year for an annual wellness visit. Ask your health care provider how often you should have your eyes and teeth checked. Stay up to date on all vaccines. This information is not intended to replace advice given to you by your health care provider. Make sure you discuss any questions you have with your healthcare provider. Document Revised: 06/22/2020 Document Reviewed: 05/30/2018 Elsevier Patient Education  2022 [employer].

## 2021-03-11 NOTE — Progress Notes (Signed)
Subjective:    Patient ID: Judy Shaw, female    DOB: [birth year], 40 y.o.   MRN: [id number]  HPI Chief Complaint  Patient presents with   Annual Exam    CPE with fasting labs    She is here for a complete physical exam.  Other providers: Dr. Claiborne Billings at [facility]  New earrings in right ear. States they are not healing as well as she expected. No drainage or sign of infection.   Husband had vasectomy 6 months.   Social history: Lives with husband and 2 children, works at [employer]  Denies smoking, drinking alcohol, drug use  Diet: good. Cooks at home  Excerise: nothing regular. Walks   Health maintenance:   Mammogram: summer of 2021  Colonoscopy: 2005 in [place]. For fissures and hemorrhoids. Benign polyps were found but no follow up. Father with colon cancer.  Last Gynecological Exam: summer of 2021 Last Dental Exam: twice annually  Last Eye Exam: 2018   Wears seatbelt always, uses sunscreen, smoke detectors in home and functioning, does not text while driving and feels safe in home environment.   Reviewed allergies, medications, past medical, surgical, family, and social history.   Review of Systems Review of Systems Constitutional: -fever, -chills, -sweats, -unexpected weight change,-fatigue ENT: -runny nose, -ear pain, -sore throat Cardiology:  -chest pain, -palpitations, -edema Respiratory: -cough, -shortness of breath, -wheezing Gastroenterology: -abdominal pain, -nausea, -vomiting, -diarrhea, -constipation  Hematology: -bleeding or bruising problems Musculoskeletal: -arthralgias, -myalgias, -joint swelling, -back pain Ophthalmology: -vision changes Urology: -dysuria, -difficulty urinating, -hematuria, -urinary frequency, -urgency Neurology: -headache, -weakness, -tingling, -numbness       Objective:   Physical Exam BP 120/72   Pulse 68   Ht 5' 3.5" (1.613 m)   Wt 162 lb 6.4 oz (73.7 kg)   SpO2 98%   BMI 28.32  kg/m   General Appearance:    Alert, cooperative, no distress, appears stated age  Head:    Normocephalic, without obvious abnormality, atraumatic  Eyes:    PERRL, conjunctiva/corneas clear, EOM's intact  Ears:    Normal TM's and canals. Right external ear with multiple piercings and no sign of infection. 2 of the piercing holes have small raised flesh toned papules.   Nose:   Mask on   Throat:   Mask on  Neck:   Supple, no lymphadenopathy;  thyroid:  no   enlargement/tenderness/nodules; no carotid   bruit or JVD  Back:    Spine nontender, ROM normal, no CVA tenderness  Lungs:     Clear to auscultation bilaterally without wheezes, rales or     ronchi; respirations unlabored  Chest Wall:    No tenderness or deformity   Heart:    Regular rate and rhythm, S1 and S2 normal, no murmur, rub   or gallop  Breast Exam:    OB/GYN  Abdomen:     Soft, non-tender, nondistended, normoactive bowel sounds,    no masses, no hepatosplenomegaly  Genitalia:    OB/GYN      Extremities:   No clubbing, cyanosis or edema  Pulses:   2+ and symmetric all extremities  Skin:   Skin color, texture, turgor normal, no rashes or lesions  Lymph nodes:   Cervical, supraclavicular, and axillary nodes normal  Neurologic:   CNII-XII intact, normal strength, sensation and gait          Psych:   Normal mood, affect, hygiene and grooming.  Assessment & Plan:  Routine general medical examination at a health care facility - Plan: CBC with Differential/Platelet, Comprehensive metabolic panel, TSH, T4, free, Lipid panel -Preventive health care reviewed.  She sees her OB/GYN.  She is in good spirits.  Counseling on healthy lifestyle including diet and exercise.  Recommend regular dental and eye exams.  Discussed safety and health promotion.  Reviewed immunizations.  History of colonic polyps - Plan: Ambulatory referral to Gastroenterology  Family history of rectal cancer - Plan: Ambulatory referral to  Gastroenterology  History of anal fissures - Plan: Ambulatory referral to Gastroenterology  Intermittent constipation - Plan: Ambulatory referral to Gastroenterology  Screening for lipid disorders - Plan: Lipid panel  Screening for thyroid disorder - Plan: TSH, T4, free  Need for hepatitis C screening test - Plan: Hepatitis C antibody -Done per screening guidelines

## 2021-03-12 LAB — COMPREHENSIVE METABOLIC PANEL
ALT: 15 IU/L (ref 0–32)
AST: 14 IU/L (ref 0–40)
Albumin/Globulin Ratio: 2 (ref 1.2–2.2)
Albumin: 4.7 g/dL (ref 3.8–4.8)
Alkaline Phosphatase: 83 IU/L (ref 44–121)
BUN/Creatinine Ratio: 12 (ref 9–23)
BUN: 9 mg/dL (ref 6–24)
Bilirubin Total: 0.5 mg/dL (ref 0.0–1.2)
CO2: 21 mmol/L (ref 20–29)
Calcium: 9.7 mg/dL (ref 8.7–10.2)
Chloride: 101 mmol/L (ref 96–106)
Creatinine, Ser: 0.78 mg/dL (ref 0.57–1.00)
Globulin, Total: 2.3 g/dL (ref 1.5–4.5)
Glucose: 95 mg/dL (ref 65–99)
Potassium: 4.1 mmol/L (ref 3.5–5.2)
Sodium: 140 mmol/L (ref 134–144)
Total Protein: 7 g/dL (ref 6.0–8.5)
eGFR: 98 mL/min/{1.73_m2} (ref >59)

## 2021-03-12 LAB — CBC WITH DIFFERENTIAL/PLATELET
Basophils Absolute: 0 10*3/uL (ref 0.0–0.2)
Basos: 0 %
EOS (ABSOLUTE): 0.4 10*3/uL (ref 0.0–0.4)
Eos: 3 %
Hematocrit: 42.4 % (ref 34.0–46.6)
Hemoglobin: 14.4 g/dL (ref 11.1–15.9)
Immature Grans (Abs): 0 10*3/uL (ref 0.0–0.1)
Immature Granulocytes: 0 %
Lymphocytes Absolute: 2.4 10*3/uL (ref 0.7–3.1)
Lymphs: 22 %
MCH: 32.3 pg (ref 26.6–33.0)
MCHC: 34 g/dL (ref 31.5–35.7)
MCV: 95 fL (ref 79–97)
Monocytes Absolute: 0.8 10*3/uL (ref 0.1–0.9)
Monocytes: 7 %
Neutrophils Absolute: 7.4 10*3/uL — ABNORMAL HIGH (ref 1.4–7.0)
Neutrophils: 68 %
Platelets: 224 10*3/uL (ref 150–450)
RBC: 4.46 x10E6/uL (ref 3.77–5.28)
RDW: 11.4 % — ABNORMAL LOW (ref 11.7–15.4)
WBC: 10.9 10*3/uL — ABNORMAL HIGH (ref 3.4–10.8)

## 2021-03-12 LAB — LIPID PANEL
Chol/HDL Ratio: 2.9 ratio (ref 0.0–4.4)
Cholesterol, Total: 182 mg/dL (ref 100–199)
HDL: 63 mg/dL (ref >39)
LDL Chol Calc (NIH): 101 mg/dL — ABNORMAL HIGH (ref 0–99)
Triglycerides: 101 mg/dL (ref 0–149)
VLDL Cholesterol Cal: 18 mg/dL (ref 5–40)

## 2021-03-12 LAB — TSH: TSH: 2.71 u[IU]/mL (ref 0.450–4.500)

## 2021-03-12 LAB — HEPATITIS C ANTIBODY: Hep C Virus Ab: <0.1 s/co ratio (ref 0.0–0.9)

## 2021-03-12 LAB — T4, FREE: Free T4: 1.01 ng/dL (ref 0.82–1.77)

## 2021-03-18 ENCOUNTER — Encounter: Payer: Self-pay | Admitting: Family Medicine

## 2021-03-23 ENCOUNTER — Encounter: Payer: Self-pay | Admitting: Gastroenterology

## 2021-04-03 ENCOUNTER — Telehealth: Payer: Commercial Managed Care - PPO | Admitting: Nurse Practitioner

## 2021-04-03 DIAGNOSIS — U071 COVID-19: Secondary | ICD-10-CM

## 2021-04-04 MED ORDER — ALBUTEROL SULFATE HFA 108 (90 BASE) MCG/ACT IN AERS
2.0000 | INHALATION_SPRAY | Freq: Four times a day (QID) | RESPIRATORY_TRACT | 0 refills | Status: DC | PRN
Start: 2021-04-04 — End: 2021-04-28

## 2021-04-04 NOTE — Progress Notes (Signed)
E-Visit  for Positive Covid Test Result  We are sorry you are not feeling well. We are here to help!  If you are interested in discussing antiviral management we encourage you to schedule a video visit. If you are looking for advice on over the counter management and when to return to work we can provide that here.   We will also attach a return to work note as well.   You have tested positive for COVID-19, meaning that you were infected with the novel coronavirus and could give the virus to others.  It is vitally important that you stay home so you do not spread it to others.      Please continue isolation at home, for at least 10 days since the start of your symptoms and until you have had 24 hours with no fever (without taking a fever reducer) and with improving of symptoms.  If you have no symptoms but tested positive (or all symptoms resolve after 5 days and you have no fever) you can leave your house but continue to wear a mask around others for an additional 5 days. If you have a fever,continue to stay home until you have had 24 hours of no fever. Most cases improve 5-10 days from onset but we have seen a small number of patients who have gotten worse after the 10 days.  Please be sure to watch for worsening symptoms and remain taking the proper precautions.   Go to the nearest hospital ED for assessment if fever/cough/breathlessness are severe or illness seems like a threat to life.    The following symptoms may appear 2-14 days after exposure: Fever Cough Shortness of breath or difficulty breathing Chills Repeated shaking with chills Muscle pain Headache Sore throat New loss of taste or smell Fatigue Congestion or runny nose Nausea or vomiting Diarrhea  You have been enrolled in MyChart Home Monitoring for COVID-19. Daily you will receive a questionnaire within the MyChart website. Our COVID-19 response team will be monitoring your responses daily.  You can use  medication such as  prescription inhaler called Albuterol MDI 90 mcg /actuation 2 puffs every 4 hours as needed for shortness of breath, wheezing, cough  You may also take acetaminophen (Tylenol) as needed for fever.  HOME CARE: Only take medications as instructed by your medical team. Drink plenty of fluids and get plenty of rest. A steam or ultrasonic humidifier can help if you have congestion.   GET HELP RIGHT AWAY IF YOU HAVE EMERGENCY WARNING SIGNS.  Call 911 or proceed to your closest emergency facility if: You develop worsening high fever. Trouble breathing Bluish lips or face Persistent pain or pressure in the chest New confusion Inability to wake or stay awake You cough up blood. Your symptoms become more severe Inability to hold down food or fluids  This list is not all possible symptoms. Contact your medical provider for any symptoms that are severe or concerning to you.    Your e-visit answers were reviewed by a board certified advanced clinical practitioner to complete your personal care plan.  Depending on the condition, your plan could have included both over the counter or prescription medications.  If there is a problem please reply once you have received a response from your provider.  Your safety is important to [place].  If you have drug allergies check your prescription carefully.    You can use MyChart to ask questions about today's visit, request a non-urgent call back,  or ask for a work or school excuse for 24 hours related to this e-Visit. If it has been greater than 24 hours you will need to follow up with your provider, or enter a new e-Visit to address those concerns. You will get an e-mail in the next two days asking about your experience.  I hope that your e-visit has been valuable and will speed your recovery. Thank you for using e-visits.   I spent approximately 7 minutes reviewing the patient's chart, history and coordinating her care today.

## 2021-04-28 ENCOUNTER — Ambulatory Visit (INDEPENDENT_AMBULATORY_CARE_PROVIDER_SITE_OTHER): Payer: Commercial Managed Care - PPO | Admitting: Gastroenterology

## 2021-04-28 ENCOUNTER — Encounter: Payer: Self-pay | Admitting: Gastroenterology

## 2021-04-28 VITALS — BP 110/60 | HR 72 | Ht 63.0 in | Wt 164.2 lb

## 2021-04-28 DIAGNOSIS — Z800 Family history of malignant neoplasm of digestive organs: Secondary | ICD-10-CM | POA: Diagnosis not present

## 2021-04-28 DIAGNOSIS — Z8371 Family history of colonic polyps: Secondary | ICD-10-CM | POA: Diagnosis not present

## 2021-04-28 DIAGNOSIS — R194 Change in bowel habit: Secondary | ICD-10-CM | POA: Diagnosis not present

## 2021-04-28 NOTE — Progress Notes (Signed)
Referring Provider: Avanell Shackleton, NP-C Primary Care Physician:  Avanell Shackleton, NP-C  Reason for Consultation:  Family history of colon cancer    IMPRESSION:  Family history of colon cancer (father with colon cancer in his 8s) Family history of colon polyps (brother) Alternating diarrhea and constipation without abdominal pain History of hemorrhoids History of anal fissure   PLAN: Obtain records from [facility] and [facility] Minimize exposure to foods and alcohol that may trigger symptoms Continue current medications including Miralax, Metamucil, OTC therapy for hemorrhoids Colonoscopy Will adjust treatment recommendations after colonoscopy Low threshold to screen for celiac and other IBS mimickers   Please see the "Patient Instructions" section for addition details about the plan.  HPI: Judy Shaw is a 40 y.o. female referred by Hetty Blend, PA for family history of colon cancer and polyps, constipation, and history of anal fissures. The history is obtained through the patient and review of her electronic health record. She has a history of anxiety, asthma, and frequent urinary tract infections.   Near lifetime history of rectal bleeding dating back to childhood. Has had difficulties with both hemorrhoids and fissures. May have worsened during pregnancy.   Has some alternating diarrhea and constipation. No abdominal pain. No charge in symptoms with eating or movement. She has friends with IBS and doesn't feel that's her problem.  Drinks Public relations account executive daily. Symptoms are triggered with alcohol consumption.  No association of symptoms with gluten, dairy or sugar substitutes.   Uses Miralax daily. Would like not to follow a strict diet.  Some improvement using daily Metamucil to control her symptoms. Uses OTC treatments. Required topical therapy for an anal fissure after a pregnancy.   Colonoscopy 2005  in [place], [place] for fissure and hemorrhoids. Benign  polyps found per patient report.   Father with colon cancer diagnosed with stage 4 colon cancer in his 17s. Brother with colon polyps and fissure. No other known family history of colon cancer or polyps. No family history of uterine/endometrial cancer, pancreatic cancer or gastric/stomach cancer.  Very motivated to stay healthy given her family history. Does not want to get colon cancer.   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   No current facility-administered medications for this visit.    Allergies as of 04/28/2021 - Review Complete 04/28/2021  Allergen Reaction Noted   Macrobid [nitrofurantoin macrocrystal] Anaphylaxis 10/06/2012    Family History  Problem Relation Age of Onset   Diverticulitis Mother    Alzheimer's disease Mother 20   Heart Problems Father        palpitations   Diabetes Father 68   Colon cancer Father        colorectal   Heart Problems Brother  palpitations   Hypertension Brother    Colon polyps Brother    Anal fissures Brother    Alzheimer's disease Maternal Grandfather    Hypertension Other        Review of Systems: 12 system ROS is negative except as noted above with the addition of anxiety.   Physical Exam: General:   Alert,  well-nourished, pleasant and cooperative in NAD Head:   Normocephalic and atraumatic. Eyes:  Sclera clear, no icterus.   Conjunctiva pink. Ears:  Normal auditory acuity. Nose:  No deformity, discharge,  or lesions. Mouth:  No deformity or lesions.   Neck:  Supple; no masses or thyromegaly. Lungs:  Clear throughout to auscultation.   No wheezes. Heart:  Regular rate and rhythm; no murmurs. Abdomen:  Soft,nontender, nondistended, normal bowel sounds, no rebound or guarding. No hepatosplenomegaly.   Rectal:  Deferred to colonoscopy Msk:  Symmetrical. No boney deformities LAD: No inguinal or umbilical LAD Extremities:  No clubbing or edema. Neurologic:  Alert and  oriented x4;  grossly nonfocal Skin:  Intact without significant lesions or rashes. Psych:  Alert and cooperative. Normal mood and affect.    Jaishon Krisher L. Orvan Falconer, MD, MPH 05/01/2021, 4:23 PM

## 2021-04-28 NOTE — Patient Instructions (Addendum)
It was my pleasure to provide care to you today. Based on our discussion, I am providing you with my recommendations below:  RECOMMENDATION(S):   Minimize exposure to foods and alcohol that may trigger symptoms  RECORDS:  We have asked you to sign a Release of Information today to obtain records from [facility] and [facility].   COLONOSCOPY:   You have been scheduled for a colonoscopy. Please follow written instructions given to you at your visit today.   PREP:   Please pick up your prep supplies at the pharmacy within the next 1-3 days.  INHALERS:   If you use inhalers (even only as needed), please bring them with you on the day of your procedure.  COLONOSCOPY TIPS:  To reduce nausea and dehydration, stay well hydrated for 3-4 days prior to the exam.  To prevent skin/hemorrhoid irritation - prior to wiping, put A&Dointment or vaseline on the toilet paper. Keep a towel or pad on the bed.  BEFORE STARTING YOUR PREP, drink  64oz of clear liquids in the morning. This will help to flush the colon and will ensure you are well hydrated!!!!  NOTE - This is in addition to the fluids required for to complete your prep. Use of a flavored hard candy, such as grape Rubin Payor, can counteract some of the flavor of the prep and may prevent some nausea.   FOLLOW UP:  After your procedure, you will receive a call from my office staff regarding my recommendation for follow up.  BMI:  If you are age 27 or younger, your body mass index should be between 19-25. Your Body mass index is 29.1 kg/m. If this is out of the aformentioned range listed, please consider follow up with your Primary Care Provider.   MY CHART:  The [facility] providers would like to encourage you to use [facility] to communicate with providers for non-urgent requests or questions.  Due to long hold times on the telephone, sending your provider a message by [facility] may be a faster and more efficient way to get a  response.  Please allow 48 business hours for a response.  Please remember that this is for non-urgent requests.   Thank you for trusting me with your gastrointestinal care!    Tressia Danas, MD, MPH

## 2021-04-28 NOTE — Progress Notes (Signed)
Upon entering exam room to request signature of ROI, I asked if pt could provide me with the names of the providers from [facility] and [place]. Pt stated she was unable to do so and further added she could not provide me with a specific location nor phone number in which to send the signed requests. Advised pt to try to do some research if able to help [place] in obtaining the records Dr. Orvan Falconer is requesting. ROI was not signed today as pt states she will call with additional information so that we may obtain records. Will await information pt.

## 2021-05-01 ENCOUNTER — Encounter: Payer: Self-pay | Admitting: Gastroenterology

## 2021-06-02 HISTORY — PX: COLONOSCOPY: SHX174

## 2021-06-03 ENCOUNTER — Encounter: Payer: Self-pay | Admitting: Gastroenterology

## 2021-06-03 ENCOUNTER — Ambulatory Visit (AMBULATORY_SURGERY_CENTER): Payer: Commercial Managed Care - PPO | Admitting: Gastroenterology

## 2021-06-03 ENCOUNTER — Other Ambulatory Visit: Payer: Self-pay

## 2021-06-03 VITALS — BP 121/60 | HR 74 | Temp 98.4°F | Resp 18 | Ht 63.0 in | Wt 164.0 lb

## 2021-06-03 DIAGNOSIS — R194 Change in bowel habit: Secondary | ICD-10-CM | POA: Diagnosis present

## 2021-06-03 DIAGNOSIS — K5900 Constipation, unspecified: Secondary | ICD-10-CM | POA: Diagnosis not present

## 2021-06-03 DIAGNOSIS — R1084 Generalized abdominal pain: Secondary | ICD-10-CM | POA: Diagnosis not present

## 2021-06-03 DIAGNOSIS — R197 Diarrhea, unspecified: Secondary | ICD-10-CM

## 2021-06-03 MED ORDER — SODIUM CHLORIDE 0.9 % IV SOLN: 500.0000 mL | Freq: Once | INTRAVENOUS | Status: DC

## 2021-06-03 NOTE — Progress Notes (Signed)
Referring Provider: Avanell Shackleton, NP-C Primary Care Physician:  Avanell Shackleton, NP-C  Reason for Procedure:  Family history of colon cancer    IMPRESSION:  Family history of colon cancer (father with colon cancer in his 48s) Family history of colon polyps (brother) Alternating diarrhea and constipation without abdominal pain History of hemorrhoids History of anal fissure   PLAN: Colonoscopy in the [facility] today   HPI: Judy Shaw is a 40 y.o. female referred by Hetty Blend, PA for family history of colon cancer and polyps, constipation, and history of anal fissures. The history is obtained through the patient and review of her electronic health record. She has a history of anxiety, asthma, and frequent urinary tract infections.   Near lifetime history of rectal bleeding dating back to childhood. Has had difficulties with both hemorrhoids and fissures. May have worsened during pregnancy.   Has some alternating diarrhea and constipation. No abdominal pain. No charge in symptoms with eating or movement. She has friends with IBS and doesn't feel that's her problem.  Drinks Public relations account executive daily. Symptoms are triggered with alcohol consumption.  No association of symptoms with gluten, dairy or sugar substitutes.   Uses Miralax daily. Would like not to follow a strict diet.  Some improvement using daily Metamucil to control her symptoms. Uses OTC treatments. Required topical therapy for an anal fissure after a pregnancy.   Colonoscopy 2005  in [place], [place] for fissure and hemorrhoids. Benign polyps found per patient report.   Father with colon cancer diagnosed with stage 4 colon cancer in his 10s. Brother with colon polyps and fissure. No other known family history of colon cancer or polyps. No family history of uterine/endometrial cancer, pancreatic cancer or gastric/stomach cancer.  Very motivated to stay healthy given her family history. Does not want to get colon cancer.    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   Current Facility-Administered Medications  Medication Dose Route Frequency Provider Last Rate Last Admin   0.9 %  sodium chloride infusion  500 mL Intravenous Once Tressia Danas, MD        Allergies as of 06/03/2021 - Review Complete 06/03/2021  Allergen Reaction Noted   Macrobid [nitrofurantoin macrocrystal] Anaphylaxis 10/06/2012    Family History  Problem Relation Age of Onset   Diverticulitis Mother    Alzheimer's disease Mother 39   Colon cancer Father 45       colorectal   Heart Problems Father        palpitations   Diabetes Father 1   Heart Problems Brother        palpitations   Hypertension Brother    Colon polyps Brother    Anal fissures Brother  Alzheimer's disease Maternal Grandfather    Hypertension Other    Esophageal cancer Neg Hx    Stomach cancer Neg Hx       Physical Exam: General:   Alert,  well-nourished, pleasant and cooperative in NAD Head:  Normocephalic and atraumatic. Eyes:  Sclera clear, no icterus.   Conjunctiva pink. Ears:  Normal auditory acuity. Nose:  No deformity, discharge,  or lesions. Mouth:  No deformity or lesions.   Neck:  Supple; no masses or thyromegaly. Lungs:  Clear throughout to auscultation.   No wheezes. Heart:  Regular rate  and rhythm; no murmurs. Abdomen:  Soft, nontender, nondistended, normal bowel sounds, no rebound or guarding. No hepatosplenomegaly.   Rectal:  Deferred to colonoscopy Msk:  Symmetrical. No boney deformities LAD: No inguinal or umbilical LAD Extremities:  No clubbing or edema. Neurologic:  Alert and  oriented x4;  grossly nonfocal Skin:  Intact without significant lesions or rashes. Psych:  Alert and cooperative. Normal mood and affect.    Janitza Revuelta L. Orvan Falconer, MD, MPH 06/03/2021, 2:09 PM

## 2021-06-03 NOTE — Patient Instructions (Signed)
Thank you for allowing [place] to care for you today! Resume previous diet today.  Return to normal daily activities tomorrow. Recommend next colonoscopy in 5 years based on family history  YOU HAD AN ENDOSCOPIC PROCEDURE TODAY AT THE [facility]:   Refer to the procedure report that was given to you for any specific questions about what was found during the examination.  If the procedure report does not answer your questions, please call your gastroenterologist to clarify.  If you requested that your care partner not be given the details of your procedure findings, then the procedure report has been included in a sealed envelope for you to review at your convenience later.  YOU SHOULD EXPECT: Some feelings of bloating in the abdomen. Passage of more gas than usual.  Walking can help get rid of the air that was put into your GI tract during the procedure and reduce the bloating. If you had a lower endoscopy (such as a colonoscopy or flexible sigmoidoscopy) you may notice spotting of blood in your stool or on the toilet paper. If you underwent a bowel prep for your procedure, you may not have a normal bowel movement for a few days.  Please Note:  You might notice some irritation and congestion in your nose or some drainage.  This is from the oxygen used during your procedure.  There is no need for concern and it should clear up in a day or so.  SYMPTOMS TO REPORT IMMEDIATELY:  Following lower endoscopy (colonoscopy or flexible sigmoidoscopy):  Excessive amounts of blood in the stool  Significant tenderness or worsening of abdominal pains  Swelling of the abdomen that is new, acute  Fever of 100F or higher  For urgent or emergent issues, a gastroenterologist can be reached at any hour by calling [phone]. Do not use MyChart messaging for urgent concerns.    DIET:  We do recommend a small meal at first, but then you may proceed to your regular diet.  Drink plenty of fluids but you  should avoid alcoholic beverages for 24 hours.  ACTIVITY:  You should plan to take it easy for the rest of today and you should NOT DRIVE or use heavy machinery until tomorrow (because of the sedation medicines used during the test).    FOLLOW UP: Our staff will call the number listed on your records 48-72 hours following your procedure to check on you and address any questions or concerns that you may have regarding the information given to you following your procedure. If we do not reach you, we will leave a message.  We will attempt to reach you two times.  During this call, we will ask if you have developed any symptoms of COVID 19. If you develop any symptoms (ie: fever, flu-like symptoms, shortness of breath, cough etc.) before then, please call [phone].  If you test positive for Covid 19 in the 2 weeks post procedure, please call and report this information to [place].    If any biopsies were taken you will be contacted by phone or by letter within the next 1-3 weeks.  Please call us at [phone] if you have not heard about the biopsies in 3 weeks.    SIGNATURES/CONFIDENTIALITY: You and/or your care partner have signed paperwork which will be entered into your electronic medical record.  These signatures attest to the fact that that the information above on your After Visit Summary has been reviewed and is understood.  Full responsibility of  the confidentiality of this discharge information lies with you and/or your care-partner.

## 2021-06-03 NOTE — Progress Notes (Signed)
Called to room to assist during endoscopic procedure.  Patient ID and intended procedure confirmed with present staff. Received instructions for my participation in the procedure from the performing physician.  

## 2021-06-03 NOTE — Progress Notes (Signed)
A and O x3. Report to RN. Tolerated MAC anesthesia well. 

## 2021-06-03 NOTE — Op Note (Signed)
[facility] Patient Name: Judy PennaJennifer Shaw Procedure Date: 06/03/2021 1:38 PM MRN: [id number] Endoscopist: Tressia DanasKimberly Kaoir Loree MD, MD Age: 40 Referring MD:  Date of Birth: 1981-07-09 Gender: Female Account #: [id number] Procedure:                Colonoscopy Indications:              Screening patient at increased risk: Family history                            of colorectal cancer in multiple 1st-degree                            relatives                           Family history of colon cancer (father with colon                            cancer in his 3960s)                           Family history of colon polyps (brother)                           Incidental history of : Alternating diarrhea and                            constipation without abdominal pain Medicines:                Monitored Anesthesia Care Procedure:                Pre-Anesthesia Assessment:                           - Prior to the procedure, a History and Physical                            was performed, and patient medications and                            allergies were reviewed. The patient's tolerance of                            previous anesthesia was also reviewed. The risks                            and benefits of the procedure and the sedation                            options and risks were discussed with the patient.                            All questions were answered, and informed consent                            was obtained. Prior  Anticoagulants: The patient has                            taken no previous anticoagulant or antiplatelet                            agents. ASA Grade Assessment: II - A patient with                            mild systemic disease. After reviewing the risks                            and benefits, the patient was deemed in                            satisfactory condition to undergo the procedure.                           After obtaining informed  consent, the colonoscope                            was passed under direct vision. Throughout the                            procedure, the patient's blood pressure, pulse, and                            oxygen saturations were monitored continuously. The                            CF [id number] #[id number] was introduced through the anus                            and advanced to the 3 cm into the ileum. A second                            forward view of the right colon was performed. The                            colonoscopy was performed without difficulty. The                            patient tolerated the procedure well. The quality                            of the bowel preparation was good. The terminal                            ileum, ileocecal valve, appendiceal orifice, and                            rectum were photographed. Scope In: 2:18:24 PM Scope Out: 2:35:28 PM Scope Withdrawal Time: 0 hours 13 minutes 30 seconds  Total Procedure Duration: 0 hours 17 minutes 4 seconds  Findings:                 The perianal and digital rectal examinations were                            normal.                           The entire examined colonic mucosa appeared normal.                            Biopsies were taken from the right colon and the                            left colon with a cold forceps for histology.                            Estimated blood loss was minimal.                           The exam was otherwise without abnormality on                            direct and retroflexion views. Complications:            No immediate complications. Estimated blood loss:                            Minimal. Estimated Blood Loss:     Estimated blood loss was minimal. Impression:               - The entire examined colon is normal. Biopsied.                           - The examination was otherwise normal on direct                            and retroflexion views. Recommendation:            - Patient has a contact number available for                            emergencies. The signs and symptoms of potential                            delayed complications were discussed with the                            patient. Return to normal activities tomorrow.                            Written discharge instructions were provided to the                            patient.                           -  Resume previous diet.                           - Continue present medications.                           - Repeat colonoscopy in 5 years for surveillance                            given the family history.                           - Emerging evidence supports eating a diet of                            fruits, vegetables, grains, calcium, and yogurt                            while reducing red meat and alcohol may reduce the                            risk of colon cancer.                           - Thank you for allowing me to be involved in your                            colon cancer prevention. Tressia Danas MD, MD 06/03/2021 2:40:03 PM This report has been signed electronically.

## 2021-06-07 ENCOUNTER — Telehealth: Payer: Self-pay | Admitting: *Deleted

## 2021-06-07 NOTE — Telephone Encounter (Signed)
Have you developed a fever since your procedure? no  2.   Have you had an respiratory symptoms (SOB or cough) since your procedure? no  3.   Have you tested positive for COVID 19 since your procedure no  4.   Have you had any family members/close contacts diagnosed with the COVID 19 since your procedure?  no   If yes to any of these questions please route to Laverna Peace, RN and Karlton Lemon, RN  Follow up Call-  Call back number 06/03/2021  Post procedure Call Back phone  # [phone]  Permission to leave phone message Yes  Some recent data might be hidden     Patient questions:  Do you have a fever, pain , or abdominal swelling? No. Pain Score  0 *  Have you tolerated food without any problems? Yes.    Have you been able to return to your normal activities? Yes.    Do you have any questions about your discharge instructions: Diet   No. Medications  No. Follow up visit  No.  Do you have questions or concerns about your Care? No.  Actions: * If pain score is 4 or above: No action needed, pain <4.

## 2021-06-12 ENCOUNTER — Encounter: Payer: Self-pay | Admitting: Gastroenterology

## 2021-10-04 ENCOUNTER — Telehealth: Payer: BC Managed Care – PPO | Admitting: Nurse Practitioner

## 2021-10-04 ENCOUNTER — Telehealth: Payer: Commercial Managed Care - PPO | Admitting: Family Medicine

## 2021-10-04 DIAGNOSIS — A09 Infectious gastroenteritis and colitis, unspecified: Secondary | ICD-10-CM

## 2021-10-04 MED ORDER — AZITHROMYCIN 500 MG PO TABS: ORAL_TABLET | ORAL | 0 refills | Status: DC

## 2021-10-04 NOTE — Progress Notes (Signed)
We are sorry that you are not feeling well.  Here is how we plan to help!  Based on what you have shared with me it looks like you have Acute Infectious Diarrhea.  Most cases of acute diarrhea are due to infections with virus and bacteria and are self-limited conditions lasting less than 14 days.  For your symptoms you may take Imodium 2 mg tablets that are over the counter at your local pharmacy. Take two tablet now and then one after each loose stool up to 6 a day.  Antibiotics are not needed for most people with diarrhea.  Optional: I have prescribed azithromycin 500 mg daily for 3 days  HOME CARE We recommend changing your diet to help with your symptoms for the next few days. Drink plenty of fluids that contain water salt and sugar. Sports drinks such as Gatorade may help.  You may try broths, soups, bananas, applesauce, soft breads, mashed potatoes or crackers.  You are considered infectious for as long as the diarrhea continues. Hand washing or use of alcohol based hand sanitizers is recommend. It is best to stay out of work or school until your symptoms stop.   GET HELP RIGHT AWAY If you have dark yellow colored urine or do not pass urine frequently you should drink more fluids.   If your symptoms worsen  If you feel like you are going to pass out (faint) You have a new problem  MAKE SURE YOU  Understand these instructions. Will watch your condition. Will get help right away if you are not doing well or get worse.  Thank you for choosing an e-visit.  Your e-visit answers were reviewed by a board certified advanced clinical practitioner to complete your personal care plan. Depending upon the condition, your plan could have included both over the counter or prescription medications.  Please review your pharmacy choice. Make sure the pharmacy is open so you can pick up prescription now. If there is a problem, you may contact your provider through [employer] and have the  prescription routed to another pharmacy.  Your safety is important to [place]. If you have drug allergies check your prescription carefully.   For the next 24 hours you can use MyChart to ask questions about today's visit, request a non-urgent call back, or ask for a work or school excuse. You will get an email in the next two days asking about your experience. I hope that your e-visit has been valuable and will speed your recovery.   I spent approximately 7 minutes reviewing the patient's history, current symptoms and coordinating their plan of care today.    Meds ordered this encounter  Medications   azithromycin (ZITHROMAX) 500 MG tablet    Sig: Take one tablet once daily for three days. Take with food    Dispense:  3 tablet    Refill:  0

## 2021-10-04 NOTE — Progress Notes (Signed)
No Showed   Link sent twice

## 2022-01-09 LAB — HM MAMMOGRAPHY

## 2022-01-20 ENCOUNTER — Ambulatory Visit: Payer: BC Managed Care – PPO | Admitting: Physician Assistant

## 2022-01-20 ENCOUNTER — Encounter: Payer: Self-pay | Admitting: Physician Assistant

## 2022-01-20 VITALS — BP 120/70 | HR 65 | Ht 63.0 in | Wt 158.4 lb

## 2022-01-20 DIAGNOSIS — F4010 Social phobia, unspecified: Secondary | ICD-10-CM

## 2022-01-20 DIAGNOSIS — Z6828 Body mass index (BMI) 28.0-28.9, adult: Secondary | ICD-10-CM | POA: Diagnosis not present

## 2022-01-20 DIAGNOSIS — R002 Palpitations: Secondary | ICD-10-CM | POA: Diagnosis not present

## 2022-01-20 NOTE — Assessment & Plan Note (Signed)
Stable, is grieving and dealing with it with therapy for now ?

## 2022-01-20 NOTE — Assessment & Plan Note (Signed)
Stay hydrated, referral to Cardiology for evaluation, thyroid panel and cbc drawn today ?

## 2022-01-20 NOTE — Patient Instructions (Signed)
You will get a call to schedule an appointment with Cardiology  ?

## 2022-01-20 NOTE — Assessment & Plan Note (Signed)
Stable, will monitor 

## 2022-01-20 NOTE — Progress Notes (Signed)
? ?Acute Office Visit ? ?Subjective:  ? ? Patient ID: Judy Shaw, female    DOB: October 24, 1980, 41 y.o.   MRN: [id number] ? ?Chief Complaint  ?Patient presents with  ? Acute Visit  ?  Pt stated that she has been having heart palpitations since October. She lost both parents this year and is wondering if its stress. She wants a cardiology referral.   ? ? ?HPI ?Patient is in today requesting a referral to Cardiology to evaluate her heart palpitations; right now denies heart palpitations, chest pain, or shortness of breath; reports about 8 years ago she went to a Cardiologist while pregnant, had a normal EKG and was told that she is fine; also shares that she is grieving the loss of both parents earlier this year - 2023; goes to therapy twice a month and it's helpful; states she is functioning fine with her busy job and home life and isn't sure if stress is affecting her palpitations; states she doesn't want to take medicine for anxiety at this time;  reports having 1 cup of coffee 5 days a week & 2 cups of coffee on weekends; drinks 8 glasses of water a day; not exercising currently, but when she does she walks 3 - 5 miles at a time. ? ?Outpatient Medications Prior to Visit  ?Medication Sig Dispense Refill  ? acyclovir (ZOVIRAX) 800 MG tablet acyclovir 800 mg tablet ? TAKE 1 TABLET BY MOUTH EVERY 6 HOURS THRU DURATION OF BREAKOUT    ? azithromycin (ZITHROMAX) 500 MG tablet Take one tablet once daily for three days. Take with food (Patient not taking: Reported on 01/20/2022) 3 tablet 0  ? ?No facility-administered medications prior to visit.  ? ? ?Allergies  ?Allergen Reactions  ? Macrobid [Nitrofurantoin Macrocrystal] Anaphylaxis  ? ? ?Review of Systems  ?Constitutional:  Negative for activity change and chills.  ?HENT:  Negative for congestion and voice change.   ?Eyes:  Negative for pain and redness.  ?Respiratory:  Negative for cough and wheezing.   ?Cardiovascular:  Positive for palpitations. Negative for chest  pain.  ?Gastrointestinal:  Negative for constipation, diarrhea, nausea and vomiting.  ?Endocrine: Negative for polyuria.  ?Genitourinary:  Negative for frequency.  ?Skin:  Negative for color change and rash.  ?Allergic/Immunologic: Negative for immunocompromised state.  ?Neurological:  Negative for dizziness.  ?Psychiatric/Behavioral:  Negative for agitation.   ? ?   ?Objective:  ?  ?Physical Exam ?Vitals and nursing note reviewed.  ?Constitutional:   ?   General: She is not in acute distress. ?   Appearance: Normal appearance. She is not ill-appearing.  ?HENT:  ?   Head: Normocephalic and atraumatic.  ?   Right Ear: External ear normal.  ?   Left Ear: External ear normal.  ?   Nose: No congestion.  ?Eyes:  ?   Extraocular Movements: Extraocular movements intact.  ?   Conjunctiva/sclera: Conjunctivae normal.  ?   Pupils: Pupils are equal, round, and reactive to light.  ?Cardiovascular:  ?   Rate and Rhythm: Normal rate and regular rhythm.  ?   Pulses: Normal pulses.  ?   Heart sounds: Normal heart sounds.  ?Pulmonary:  ?   Effort: Pulmonary effort is normal.  ?   Breath sounds: Normal breath sounds. No wheezing.  ?Abdominal:  ?   General: Bowel sounds are normal.  ?   Palpations: Abdomen is soft.  ?Musculoskeletal:     ?   General: Normal range of motion.  ?  Cervical back: Normal range of motion and neck supple.  ?   Right lower leg: No edema.  ?   Left lower leg: No edema.  ?Skin: ?   General: Skin is warm and dry.  ?   Findings: No bruising.  ?Neurological:  ?   General: No focal deficit present.  ?   Mental Status: She is alert and oriented to person, place, and time.  ?Psychiatric:     ?   Mood and Affect: Mood normal.     ?   Behavior: Behavior normal.     ?   Thought Content: Thought content normal.  ? ? ?BP 120/70   Pulse 65   Ht 5' 3"  (1.6 m)   Wt 158 lb 6.4 oz (71.8 kg)   SpO2 99%   BMI 28.06 kg/m?  ? ?Wt Readings from Last 3 Encounters:  ?01/20/22 158 lb 6.4 oz (71.8 kg)  ?06/03/21 164 lb (74.4 kg)   ?04/28/21 164 lb 4 oz (74.5 kg)  ? ?BP Readings from Last 5 Encounters:  ?01/20/22 120/70  ?06/03/21 121/60  ?04/28/21 110/60  ?03/11/21 120/72  ?06/24/20 120/62  ? ? ? ?Results for orders placed or performed in visit on 03/11/21  ?CBC with Differential/Platelet  ?Result Value Ref Range  ? WBC 10.9 (H) 3.4 - 10.8 x10E3/uL  ? RBC 4.46 3.77 - 5.28 x10E6/uL  ? Hemoglobin 14.4 11.1 - 15.9 g/dL  ? Hematocrit 42.4 34.0 - 46.6 %  ? MCV 95 79 - 97 fL  ? MCH 32.3 26.6 - 33.0 pg  ? MCHC 34.0 31.5 - 35.7 g/dL  ? RDW 11.4 (L) 11.7 - 15.4 %  ? Platelets 224 150 - 450 x10E3/uL  ? Neutrophils 68 Not Estab. %  ? Lymphs 22 Not Estab. %  ? Monocytes 7 Not Estab. %  ? Eos 3 Not Estab. %  ? Basos 0 Not Estab. %  ? Neutrophils Absolute 7.4 (H) 1.4 - 7.0 x10E3/uL  ? Lymphocytes Absolute 2.4 0.7 - 3.1 x10E3/uL  ? Monocytes Absolute 0.8 0.1 - 0.9 x10E3/uL  ? EOS (ABSOLUTE) 0.4 0.0 - 0.4 x10E3/uL  ? Basophils Absolute 0.0 0.0 - 0.2 x10E3/uL  ? Immature Granulocytes 0 Not Estab. %  ? Immature Grans (Abs) 0.0 0.0 - 0.1 x10E3/uL  ?Comprehensive metabolic panel  ?Result Value Ref Range  ? Glucose 95 65 - 99 mg/dL  ? BUN 9 6 - 24 mg/dL  ? Creatinine, Ser 0.78 0.57 - 1.00 mg/dL  ? eGFR 98 >59 mL/min/1.73  ? BUN/Creatinine Ratio 12 9 - 23  ? Sodium 140 134 - 144 mmol/L  ? Potassium 4.1 3.5 - 5.2 mmol/L  ? Chloride 101 96 - 106 mmol/L  ? CO2 21 20 - 29 mmol/L  ? Calcium 9.7 8.7 - 10.2 mg/dL  ? Total Protein 7.0 6.0 - 8.5 g/dL  ? Albumin 4.7 3.8 - 4.8 g/dL  ? Globulin, Total 2.3 1.5 - 4.5 g/dL  ? Albumin/Globulin Ratio 2.0 1.2 - 2.2  ? Bilirubin Total 0.5 0.0 - 1.2 mg/dL  ? Alkaline Phosphatase 83 44 - 121 IU/L  ? AST 14 0 - 40 IU/L  ? ALT 15 0 - 32 IU/L  ?TSH  ?Result Value Ref Range  ? TSH 2.710 0.450 - 4.500 uIU/mL  ?T4, free  ?Result Value Ref Range  ? Free T4 1.01 0.82 - 1.77 ng/dL  ?Hepatitis C antibody  ?Result Value Ref Range  ? Hep C Virus Ab <0.1 0.0 - 0.9 s/co  ratio  ?Lipid panel  ?Result Value Ref Range  ? Cholesterol, Total 182 100 -  199 mg/dL  ? Triglycerides 101 0 - 149 mg/dL  ? HDL 63 >39 mg/dL  ? VLDL Cholesterol Cal 18 5 - 40 mg/dL  ? LDL Chol Calc (NIH) 101 (H) 0 - 99 mg/dL  ? Chol/HDL Ratio 2.9 0.0 - 4.4 ratio  ? ? ?   ?Assessment & Plan:  ?1. Palpitations ?- CBC with Differential/Platelet ?- Thyroid Panel With TSH ?- Ambulatory referral to Cardiology ? ?2. Social anxiety disorder ?- CBC with Differential/Platelet ?- Thyroid Panel With TSH ? ?3. Body mass index (BMI) 28.0-28.9, adult ? ? ? ?No orders of the defined types were placed in this encounter. ? ? ?Return in about 8 weeks (around 03/14/2022) for Return for Annual Exam with PCP Jimmye Norman. ? ?Irene Pap, PA-C ?

## 2022-01-21 LAB — CBC WITH DIFFERENTIAL/PLATELET
Basophils Absolute: 0 10*3/uL (ref 0.0–0.2)
Basos: 0 %
EOS (ABSOLUTE): 0.1 10*3/uL (ref 0.0–0.4)
Eos: 1 %
Hematocrit: 41.9 % (ref 34.0–46.6)
Hemoglobin: 14.6 g/dL (ref 11.1–15.9)
Immature Grans (Abs): 0 10*3/uL (ref 0.0–0.1)
Immature Granulocytes: 0 %
Lymphocytes Absolute: 1.6 10*3/uL (ref 0.7–3.1)
Lymphs: 15 %
MCH: 32.8 pg (ref 26.6–33.0)
MCHC: 34.8 g/dL (ref 31.5–35.7)
MCV: 94 fL (ref 79–97)
Monocytes Absolute: 0.6 10*3/uL (ref 0.1–0.9)
Monocytes: 6 %
Neutrophils Absolute: 8.2 10*3/uL — ABNORMAL HIGH (ref 1.4–7.0)
Neutrophils: 78 %
Platelets: 203 10*3/uL (ref 150–450)
RBC: 4.45 x10E6/uL (ref 3.77–5.28)
RDW: 11.7 % (ref 11.7–15.4)
WBC: 10.7 10*3/uL (ref 3.4–10.8)

## 2022-01-21 LAB — THYROID PANEL WITH TSH
Free Thyroxine Index: 1.6 (ref 1.2–4.9)
T3 Uptake Ratio: 25 % (ref 24–39)
T4, Total: 6.5 ug/dL (ref 4.5–12.0)
TSH: 2.01 u[IU]/mL (ref 0.450–4.500)

## 2022-02-01 ENCOUNTER — Ambulatory Visit: Payer: BC Managed Care – PPO | Admitting: Cardiovascular Disease

## 2022-02-01 ENCOUNTER — Encounter: Payer: Self-pay | Admitting: Cardiovascular Disease

## 2022-02-01 VITALS — BP 110/69 | HR 68 | Ht 63.0 in | Wt 160.6 lb

## 2022-02-01 DIAGNOSIS — R002 Palpitations: Secondary | ICD-10-CM

## 2022-02-01 DIAGNOSIS — Z8249 Family history of ischemic heart disease and other diseases of the circulatory system: Secondary | ICD-10-CM

## 2022-02-01 NOTE — Progress Notes (Signed)
?Cardiology Office Note:   ? ?Date:  02/04/2022  ? ?ID:  Judy Shaw, DOB [birth year], MRN [id number] ? ?PCP:  Judy Pap, PA-C ?  ?[facility] Providers ?Cardiologist:  Sanda Klein, MD    ? ?Referring MD: Judy Pap, PA-C  ? ?No chief complaint on file. ?Judy Shaw is a 41 y.o. female who is being seen today for the evaluation of palpitations at the request of Judy Shaw. ? ? ?History of Present Illness:   ? ?Judy Shaw is a 41 y.o. female with a hx of generally good cardiovascular health, who had problems with palpitations during her pregnancy in 2015.  These subsided, but have recurred since last October, as she went through a very difficult time, losing both her parents in  a short span.  The palpitation can happen "dozens of times a day".  She appears to describe isolated irregular "flutters", not a sustained arrhythmia.  She has taken several recordings with her Apple Watch that mostly it is shows sinus rhythm or mild artifact, but at least a couple of them show PVCs. ? ?The PVCs worry her, but are not associated with chest pain, dyspnea, syncope or focal neurological complaints.  She denies exertional angina or dyspnea, orthopnea, PND, lower extremity edema, claudication. ? ?She does not have hypertension, diabetes or hyperlipidemia and quit smoking about 20 years ago (less than 10-pack-year history of smoking).  Her most recent lipid profile was excellent with an HDL of 63 and an LDL of 101.  On the other hand, her 2 brothers, both in their early 61s have elevated calcium scores of 970 and 450 respectively. ? ?Past Medical History:  ?Diagnosis Date  ? 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 Allergy   ? AMA (advanced maternal age) multigravida 35+ 02/22/2017  ? Anal fissure   ? Asthma   ? COVID-19   ? External hemorrhoid   ? Fetal microcephaly affecting antepartum care of mother 11/28/2016  ? Heart palpitations   ? Herpes  labialis   ? History of prior pregnancy with IUGR newborn 11/28/2016  ? Hyperplastic colon polyp   ? Increased body mass index 04/23/2020  ? Medical history non-contributory   ? Postpartum state 02/22/2017  ? Social anxiety disorder 05/22/2019  ? UTI (urinary tract infection)   ? history - 7 last year in 2013 per patient  ? Vacuum extractor delivery, delivered 10/31/2013  ? Vaginal itching 05/10/2016  ? ? ?Past Surgical History:  ?Procedure Laterality Date  ? COLONOSCOPY    ? CYSTOSCOPY    ? WISDOM TOOTH EXTRACTION    ? ? ?Current Medications: ?Current Meds  ?Medication Sig  ? VITAMIN D, CHOLECALCIFEROL, PO Take 1 tablet by mouth daily.  ?  ? ?Allergies:   Macrobid [nitrofurantoin macrocrystal]  ? ?Social History  ? ?Socioeconomic History  ? Marital status: Married  ?  Spouse name: Not on file  ? Number of children: 2  ? Years of education: Not on file  ? Highest education level: Not on file  ?Occupational History  ? Occupation: Production assistant, radio  ?Tobacco Use  ? Smoking status: Former  ?  Years: 10.00  ?  Types: Cigarettes  ?  Quit date: 10/03/2003  ?  Years since quitting: 18.3  ? Smokeless tobacco: Never  ?Vaping Use  ? Vaping Use: Never used  ?Substance and Sexual Activity  ? Alcohol use: Yes  ?  Comment: 2 per day  ? Drug use: No  ?  Sexual activity: Yes  ?  Partners: Male  ?  Birth control/protection: Pill  ?Other Topics Concern  ? Not on file  ?Social History Narrative  ? Married, 1 daughter. Art Pharmacist, hospital at [employer]  ? ?Social Determinants of Health  ? ?Financial Resource Strain: Not on file  ?Food Insecurity: Not on file  ?Transportation Needs: Not on file  ?Physical Activity: Not on file  ?Stress: Not on file  ?Social Connections: Not on file  ?  ? ?Family History: ?The patient's family history includes Alzheimer's disease in her maternal grandfather; Alzheimer's disease (age of onset: 109) in her mother; Anal fissures in her brother; CVA in her paternal grandfather; Colon cancer (age of onset: 32) in her  father; Colon polyps in her brother; Diabetes (age of onset: 98) in her father; Diverticulitis in her mother; Heart Problems in her brother and father; Hypertension in her brother and another family member. There is no history of Esophageal cancer, Stomach cancer, Hyperlipidemia, Heart attack, or Breast cancer. ? ?ROS:   ?Please see the history of present illness.    ? All other systems reviewed and are negative. ? ?EKGs/Labs/Other Studies Reviewed:   ? ?The following studies were reviewed today: ? ? ?EKG:  EKG is  ordered today.  The ekg ordered today demonstrates normal sinus rhythm with borderline short PR interval, nonspecific ST-T changes, QTc 397 ms ? ?Recent Labs: ?03/11/2021: ALT 15; BUN 9; Creatinine, Ser 0.78; Potassium 4.1; Sodium 140 ?01/20/2022: Hemoglobin 14.6; Platelets 203; TSH 2.010  ?Recent Lipid Panel ?   ?Component Value Date/Time  ? CHOL 182 03/11/2021 1555  ? TRIG 101 03/11/2021 1555  ? HDL 63 03/11/2021 1555  ? CHOLHDL 2.9 03/11/2021 1555  ? CHOLHDL 2.6 05/11/2017 0914  ? VLDL 10 05/11/2017 0914  ? LDLCALC 101 (H) 03/11/2021 1555  ? ? ? ?Risk Assessment/Calculations:   ?  ? ?    ? ?Physical Exam:   ? ?VS:  BP 110/69 (BP Location: Left Arm, Patient Position: Sitting, Cuff Size: Normal)   Pulse 68   Ht 5\' 3"  (1.6 m)   Wt 160 lb 9.6 oz (72.8 kg)   SpO2 97%   BMI 28.45 kg/m?    ? ?Wt Readings from Last 3 Encounters:  ?02/01/22 160 lb 9.6 oz (72.8 kg)  ?01/20/22 158 lb 6.4 oz (71.8 kg)  ?06/03/21 164 lb (74.4 kg)  ?  ? ?GEN:  Well nourished, well developed in no acute distress ?HEENT: Normal ?NECK: No JVD; No carotid bruits ?LYMPHATICS: No lymphadenopathy ?CARDIAC: RRR, no murmurs, rubs, gallops ?RESPIRATORY:  Clear to auscultation without rales, wheezing or rhonchi  ?ABDOMEN: Soft, non-tender, non-distended ?MUSCULOSKELETAL:  No edema; No deformity  ?SKIN: Warm and dry ?NEUROLOGIC:  Alert and oriented x 3 ?PSYCHIATRIC:  Normal affect  ? ?ASSESSMENT:   ? ?1. Palpitations   ? ?PLAN:   ? ?In order  of problems listed above: ? ?Palpitations: Based on her description and the limited data from a smart watch these are likely to be PVCs.  Discussed the fact that these can be related to anxiety or any other type of stimulation since they can be adrenaline driven.  I have asked her to send me a few more tracings of her symptom events.  We will check an echocardiogram to make sure there is no structural abnormality. TSH is normal. ?Family history of elevated coronary calcium score: Despite the fact that her 2 brothers have elevated calcium score at a relatively young age, there is really  no family history of hard events such as myocardial infarction or premature stroke.  She has 1 grandparent that had a stroke at an advanced age).  She has an excellent lipid profile.  She no longer smokes.  Her risk of CAD is quite low.  We discussed the fact that for her age we do not have standardized data for the calcium score and even a calcium score of 0 might be misleading.  Can consider following up on this in a few more years. ? ?   ? ?   ? ? ?Medication Adjustments/Labs and Tests Ordered: ?Current medicines are reviewed at length with the patient today.  Concerns regarding medicines are outlined above.  ?Orders Placed This Encounter  ?Procedures  ? EKG 12-Lead  ? ECHOCARDIOGRAM COMPLETE  ? ?No orders of the defined types were placed in this encounter. ? ? ?Patient Instructions  ?Medication Instructions:  ?No changes ?*If you need a refill on your cardiac medications before your next appointment, please call your pharmacy* ? ? ?Lab Work: ?None ordered ?If you have labs (blood work) drawn today and your tests are completely normal, you will receive your results only by: ?MyChart Message (if you have MyChart) OR ?A paper copy in the mail ?If you have any lab test that is abnormal or we need to change your treatment, we will call you to review the results. ? ? ?Testing/Procedures: ?Your physician has requested that you have an  echocardiogram. Echocardiography is a painless test that uses sound waves to create images of your heart. It provides your doctor with information about the size and shape of your heart and how well your hear

## 2022-02-01 NOTE — Patient Instructions (Addendum)
Medication Instructions:  No changes *If you need a refill on your cardiac medications before your next appointment, please call your pharmacy*   Lab Work: None ordered If you have labs (blood work) drawn today and your tests are completely normal, you will receive your results only by: MyChart Message (if you have MyChart) OR A paper copy in the mail If you have any lab test that is abnormal or we need to change your treatment, we will call you to review the results.   Testing/Procedures: Your physician has requested that you have an echocardiogram. Echocardiography is a painless test that uses sound waves to create images of your heart. It provides your doctor with information about the size and shape of your heart and how well your heart's chambers and valves are working. You may receive an ultrasound enhancing agent through an IV if needed to better visualize your heart during the echo.This procedure takes approximately one hour. There are no restrictions for this procedure. This will take place at the [address], [address].    Follow-Up: At [facility], you and your health needs are our priority.  As part of our continuing mission to provide you with exceptional heart care, we have created designated Provider Care Teams.  These Care Teams include your primary Cardiologist (physician) and Advanced Practice Providers (APPs -  Physician Assistants and Nurse Practitioners) who all work together to provide you with the care you need, when you need it.  We recommend signing up for the patient portal called "MyChart".  Sign up information is provided on this After Visit Summary.  MyChart is used to connect with patients for Virtual Visits (Telemedicine).  Patients are able to view lab/test results, encounter notes, upcoming appointments, etc.  Non-urgent messages can be sent to your provider as well.   To learn more about what you can do with MyChart, go to [email or web address].     Your next appointment:   Follow up as needed with Dr. Croitoru  Important Information About Sugar       

## 2022-02-13 ENCOUNTER — Ambulatory Visit (HOSPITAL_COMMUNITY): Payer: BC Managed Care – PPO | Attending: Cardiovascular Disease

## 2022-02-13 DIAGNOSIS — R002 Palpitations: Secondary | ICD-10-CM | POA: Diagnosis not present

## 2022-02-13 LAB — ECHOCARDIOGRAM COMPLETE
Area-P 1/2: 4.57 cm2
S' Lateral: 2.5 cm

## 2022-02-13 MED ORDER — PERFLUTREN LIPID MICROSPHERE
1.0000 mL | INTRAVENOUS | Status: AC | PRN
  Administered 2022-02-13: 2 mL via INTRAVENOUS

## 2022-02-14 ENCOUNTER — Encounter: Payer: Self-pay | Admitting: Cardiovascular Disease

## 2022-02-14 DIAGNOSIS — Z8249 Family history of ischemic heart disease and other diseases of the circulatory system: Secondary | ICD-10-CM

## 2022-02-14 NOTE — Telephone Encounter (Signed)
Please get an Lp(a) assay.  ?

## 2022-03-14 ENCOUNTER — Encounter: Payer: Commercial Managed Care - PPO | Admitting: Family Medicine

## 2022-03-31 ENCOUNTER — Encounter: Payer: Self-pay | Admitting: Internal Medicine

## 2022-04-06 ENCOUNTER — Encounter: Payer: BC Managed Care – PPO | Admitting: Physician Assistant

## 2022-04-24 ENCOUNTER — Encounter: Payer: Self-pay | Admitting: Medical

## 2022-04-24 ENCOUNTER — Ambulatory Visit: Payer: BC Managed Care – PPO | Admitting: Medical

## 2022-04-24 VITALS — BP 110/70 | HR 73 | Wt 165.0 lb

## 2022-04-24 DIAGNOSIS — Z8249 Family history of ischemic heart disease and other diseases of the circulatory system: Secondary | ICD-10-CM

## 2022-04-24 DIAGNOSIS — Z0000 Encounter for general adult medical examination without abnormal findings: Secondary | ICD-10-CM | POA: Diagnosis not present

## 2022-04-24 DIAGNOSIS — F17200 Nicotine dependence, unspecified, uncomplicated: Secondary | ICD-10-CM

## 2022-04-24 DIAGNOSIS — J4540 Moderate persistent asthma, uncomplicated: Secondary | ICD-10-CM

## 2022-04-24 DIAGNOSIS — F4321 Adjustment disorder with depressed mood: Secondary | ICD-10-CM | POA: Diagnosis not present

## 2022-04-24 DIAGNOSIS — Z136 Encounter for screening for cardiovascular disorders: Secondary | ICD-10-CM

## 2022-04-24 DIAGNOSIS — E559 Vitamin D deficiency, unspecified: Secondary | ICD-10-CM

## 2022-04-24 DIAGNOSIS — Z23 Encounter for immunization: Secondary | ICD-10-CM

## 2022-04-24 DIAGNOSIS — Z13220 Encounter for screening for lipoid disorders: Secondary | ICD-10-CM

## 2022-04-24 MED ORDER — FLUTICASONE-SALMETEROL 115-21 MCG/ACT IN AERO: 2.0000 | INHALATION_SPRAY | Freq: Two times a day (BID) | RESPIRATORY_TRACT | 2 refills | Status: DC

## 2022-04-24 MED ORDER — ALBUTEROL SULFATE HFA 108 (90 BASE) MCG/ACT IN AERS: 2.0000 | INHALATION_SPRAY | Freq: Four times a day (QID) | RESPIRATORY_TRACT | 2 refills | Status: DC | PRN

## 2022-04-24 NOTE — Progress Notes (Addendum)
Subjective:   HPI  Judy Shaw is a 41 y.o. female who presents for Chief Complaint  Patient presents with   Annual Exam    Non fasting CPE- Paps done at [facility]   Patient Care Team: Lexine Baton as PCP - General (Physician Assistant) Thurmon Fair, MD as PCP - Cardiology (Cardiology) Sees dentist Sees eye doctor [facility] Dr. Tressia Danas, GI   Concerns: Here for well visit.  Moving to [place] later in the month for husband's work.    Has had a rough year.   Both of her parents passed away this year.    Given this stress she picked up smoking.  Used to smoke years ago some, but now smoking in recent weeks.   This has been flaring up her asthma though.  Using inhaler daily and thinks she needs to get back on preventative inhaler.    Was vegetarian for years, but eats meat some now.  Was curious about lipid tests and CT coronary test.   Both of her brothers had high scores with CT coronary such as in 700s, but they are older and have very different heath issues than she has.     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/2019   Allergy    AMA (advanced maternal age) multigravida 35+ 02/22/2017   Anal fissure    Asthma    COVID-19    External hemorrhoid    Fetal microcephaly affecting antepartum care of mother 11/28/2016   Heart palpitations    Herpes labialis    History of prior pregnancy with IUGR newborn 11/28/2016   Hyperplastic colon polyp    Increased body mass index 04/23/2020   Medical history non-contributory    Postpartum state 02/22/2017   Social anxiety disorder 05/22/2019   UTI (urinary tract infection)    history - 7 last year in 2013 per patient   Vacuum extractor delivery, delivered 10/31/2013   Vaginal itching 05/10/2016    Family History  Problem Relation Age of Onset   Diverticulitis Mother    Alzheimer's disease Mother 68   Cancer  Father    Colon cancer Father 68       colorectal   Heart Problems Father        palpitations   Diabetes Father 58   Heart Problems Brother        palpitations   Hypertension Brother    Colon polyps Brother    Anal fissures Brother    Alzheimer's disease Maternal Grandfather    CVA Paternal Grandfather    Hypertension Other    Esophageal cancer Neg Hx    Stomach cancer Neg Hx    Hyperlipidemia Neg Hx    Heart attack Neg Hx    Breast cancer Neg Hx      Current Outpatient Medications:    albuterol (VENTOLIN HFA) 108 (90 Base) MCG/ACT inhaler, Inhale into the lungs every 6 (six) hours as needed for wheezing or shortness of breath., Disp: , Rfl:    albuterol (VENTOLIN HFA) 108 (90 Base) MCG/ACT inhaler, Inhale 2 puffs into the lungs every 6 (six) hours as needed for wheezing or shortness of breath., Disp: 8 g, Rfl: 2   fluticasone-salmeterol (ADVAIR HFA) 115-21 MCG/ACT inhaler, Inhale 2 puffs into the lungs 2 (two) times daily., Disp: 1 each, Rfl: 2   acyclovir (ZOVIRAX) 800 MG tablet, acyclovir 800 mg tablet  TAKE 1 TABLET BY  MOUTH EVERY 6 HOURS THRU DURATION OF BREAKOUT (Patient not taking: Reported on 02/01/2022), Disp: , Rfl:   Allergies  Allergen Reactions   Macrobid [Nitrofurantoin Macrocrystal] Anaphylaxis    Reviewed their medical, surgical, family, social, medication, and allergy history and updated chart as appropriate.   Review of Systems Constitutional: -fever, -chills, -sweats, -unexpected weight change, -decreased appetite, -fatigue Allergy: -sneezing, -itching, -congestion Dermatology: -changing moles, --rash, -lumps ENT: -runny nose, -ear pain, -sore throat, -hoarseness, -sinus pain, -teeth pain, - ringing in ears, -hearing loss, -nosebleeds Cardiology: -chest pain, -palpitations, -swelling, -difficulty breathing when lying flat, -waking up short of breath Respiratory: -cough, +shortness of breath, +difficulty breathing , +wheezing, -coughing up  blood Gastroenterology: -abdominal pain, -nausea, -vomiting, -diarrhea, -constipation, -blood in stool, -changes in bowel movement, -difficulty swallowing or eating Hematology: -bleeding, -bruising  Musculoskeletal: -joint aches, -muscle aches, -joint swelling, -back pain, -neck pain, -cramping, -changes in gait Ophthalmology: denies vision changes, eye redness, itching, discharge Urology: -burning with urination, -difficulty urinating, -blood in urine, -urinary frequency, -urgency, -incontinence Neurology: -headache, -weakness, -tingling, -numbness, -memory loss, -falls, -dizziness Psychology: -depressed mood, -agitation, -sleep problems Breast/gyn: -breast tendnerss, -discharge, -lumps, -vaginal discharge,- irregular periods, -heavy periods      04/24/2022    4:24 PM 03/11/2021    3:29 PM 06/24/2020    2:58 PM 05/15/2019    8:26 AM 05/13/2018    9:17 AM  Depression screen PHQ 2/9  Decreased Interest 0 0 0 0 0  Down, Depressed, Hopeless 0 0 0 0 0  PHQ - 2 Score 0 0 0 0 0       Objective:  BP 110/70   Pulse 73   Wt 165 lb (74.8 kg)   LMP 04/03/2022   SpO2 98%   BMI 29.23 kg/m   General appearance: alert, no distress, WD/WN, Caucasian female Skin: unremarkable HEENT: normocephalic, conjunctiva/corneas normal, sclerae anicteric, PERRLA, EOMi, nares patent, no discharge or erythema, pharynx normal Oral cavity: MMM, tongue normal, teeth normal Neck: supple, no lymphadenopathy, no thyromegaly, no masses, normal ROM, no bruits Chest: non tender, normal shape and expansion Heart: RRR, normal S1, S2, no murmurs Lungs: CTA bilaterally, no wheezes, rhonchi, or rales Abdomen: +bs, soft, non tender, non distended, no masses, no hepatomegaly, no splenomegaly, no bruits Back: non tender, normal ROM, no scoliosis Musculoskeletal: upper extremities non tender, no obvious deformity, normal ROM throughout, lower extremities non tender, no obvious deformity, normal ROM throughout Extremities: no  edema, no cyanosis, no clubbing Pulses: 2+ symmetric, upper and lower extremities, normal cap refill Neurological: alert, oriented x 3, CN2-12 intact, strength normal upper extremities and lower extremities, sensation normal throughout, DTRs 2+ throughout, no cerebellar signs, gait normal Psychiatric: normal affect, behavior normal, pleasant  Breast/gyn/rectal - deferred to gynecology     Assessment and Plan :   Encounter Diagnoses  Name Primary?   Encounter for health maintenance examination in adult Yes   Moderate persistent asthma without complication    Smoker    Grieving    Family history of heart disease    Screening for heart disease    Screening for lipid disorders    Vitamin D deficiency    Need for pneumococcal vaccination      This visit was a preventative care visit, also known as wellness visit or routine physical.   Topics typically include healthy lifestyle, diet, exercise, preventative care, vaccinations, sick and well care, proper use of emergency dept and after hours care, as well as other concerns.     Recommendations:  Continue to return yearly for your annual wellness and preventative care visits.  This gives [place] a chance to discuss healthy lifestyle, exercise, vaccinations, review your chart record, and perform screenings where appropriate.  I recommend you see your eye doctor yearly for routine vision care.  I recommend you see your dentist yearly for routine dental care including hygiene visits twice yearly.   Vaccination recommendations were reviewed Immunization History  Administered Date(s) Administered   Influenza Inj Mdck Quad Pf 09/08/2016   Influenza,inj,Quad PF,6+ Mos 06/24/2020   Tdap 12/12/2016   She will return this week for labs and Pneumococal   Screening for cancer: Colon cancer screening: I reviewed your colonoscopy on file that is up to date from 06/2021  Breast cancer screening: You should perform a self breast exam monthly.    We reviewed recommendations for regular mammograms and breast cancer screening.  Cervical cancer screening: We reviewed recommendations for pap smear screening.   Skin cancer screening: Check your skin regularly for new changes, growing lesions, or other lesions of concern Come in for evaluation if you have skin lesions of concern.  Lung cancer screening: If you have a greater than 20 pack year history of tobacco use, then you may qualify for lung cancer screening with a chest CT scan.   Please call your insurance company to inquire about coverage for this test.  We currently don't have screenings for other cancers besides breast, cervical, colon, and lung cancers.  If you have a strong family history of cancer or have other cancer screening concerns, please let me know.    Bone health: Get at least 150 minutes of aerobic exercise weekly Get weight bearing exercise at least once weekly Bone density test:  A bone density test is an imaging test that uses a type of X-ray to measure the amount of calcium and other minerals in your bones. The test may be used to diagnose or screen you for a condition that causes weak or thin bones (osteoporosis), predict your risk for a broken bone (fracture), or determine how well your osteoporosis treatment is working. The bone density test is recommended for females 65 and older, or females or males <65 if certain risk factors such as thyroid disease, long term use of steroids such as for asthma or rheumatological issues, vitamin D deficiency, estrogen deficiency, family history of osteoporosis, self or family history of fragility fracture in first degree relative.    Heart health: Get at least 150 minutes of aerobic exercise weekly Limit alcohol It is important to maintain a healthy blood pressure and healthy cholesterol numbers  Heart disease screening: Screening for heart disease includes screening for blood pressure, fasting lipids,  glucose/diabetes screening, BMI height to weight ratio, reviewed of smoking status, physical activity, and diet.    Goals include blood pressure 120/80 or less, maintaining a healthy lipid/cholesterol profile, preventing diabetes or keeping diabetes numbers under good control, not smoking or using tobacco products, exercising most days per week or at least 150 minutes per week of exercise, and eating healthy variety of fruits and vegetables, healthy oils, and avoiding unhealthy food choices like fried food, fast food, high sugar and high cholesterol foods.    I reviewed cardiology notes from 01/2022    Medical care options: I recommend you continue to seek care here first for routine care.  We try really hard to have available appointments Monday through Friday daytime hours for sick visits, acute visits, and physicals.  Urgent care should be used  for after hours and weekends for significant issues that cannot wait till the next day.  The emergency department should be used for significant potentially life-threatening emergencies.  The emergency department is expensive, can often have long wait times for less significant concerns, so try to utilize primary care, urgent care, or telemedicine when possible to avoid unnecessary trips to the emergency department.  Virtual visits and telemedicine have been introduced since the pandemic started in 2020, and can be convenient ways to receive medical care.  We offer virtual appointments as well to assist you in a variety of options to seek medical care.    Separate significant issues discussed: Smoking - she is trying to take efforts to quit smoking  Asthma - begin back on Advair which she has used in the past.  Continue albuterol as needed.     Loral was seen today for annual exam.  Diagnoses and all orders for this visit:  Encounter for health maintenance examination in adult -     Comprehensive metabolic panel; Future -     CBC; Future -      VITAMIN D 25 Hydroxy (Vit-D Deficiency, Fractures); Future -     NMR, lipoprofile; Future -     Lipoprotein A (LPA); Future  Moderate persistent asthma without complication  Smoker  Grieving  Family history of heart disease -     NMR, lipoprofile; Future -     Lipoprotein A (LPA); Future  Screening for heart disease -     NMR, lipoprofile; Future -     Lipoprotein A (LPA); Future  Screening for lipid disorders -     NMR, lipoprofile; Future -     Lipoprotein A (LPA); Future  Vitamin D deficiency -     VITAMIN D 25 Hydroxy (Vit-D Deficiency, Fractures); Future  Need for pneumococcal vaccination  Other orders -     fluticasone-salmeterol (ADVAIR HFA) 115-21 MCG/ACT inhaler; Inhale 2 puffs into the lungs 2 (two) times daily. -     albuterol (VENTOLIN HFA) 108 (90 Base) MCG/ACT inhaler; Inhale 2 puffs into the lungs every 6 (six) hours as needed for wheezing or shortness of breath.    Follow-up pending labs, yearly for physical

## 2022-04-25 ENCOUNTER — Other Ambulatory Visit: Payer: BC Managed Care – PPO

## 2022-04-25 DIAGNOSIS — Z13220 Encounter for screening for lipoid disorders: Secondary | ICD-10-CM

## 2022-04-25 DIAGNOSIS — Z8249 Family history of ischemic heart disease and other diseases of the circulatory system: Secondary | ICD-10-CM

## 2022-04-25 DIAGNOSIS — Z136 Encounter for screening for cardiovascular disorders: Secondary | ICD-10-CM

## 2022-04-25 DIAGNOSIS — Z0000 Encounter for general adult medical examination without abnormal findings: Secondary | ICD-10-CM

## 2022-04-25 DIAGNOSIS — E559 Vitamin D deficiency, unspecified: Secondary | ICD-10-CM

## 2022-04-26 ENCOUNTER — Encounter: Payer: Self-pay | Admitting: Cardiovascular Disease

## 2022-04-26 LAB — COMPREHENSIVE METABOLIC PANEL
ALT: 13 IU/L (ref 0–32)
AST: 12 IU/L (ref 0–40)
Albumin/Globulin Ratio: 2.2 (ref 1.2–2.2)
Albumin: 4.8 g/dL (ref 3.9–4.9)
Alkaline Phosphatase: 85 IU/L (ref 44–121)
BUN/Creatinine Ratio: 11 (ref 9–23)
BUN: 9 mg/dL (ref 6–24)
Bilirubin Total: 0.6 mg/dL (ref 0.0–1.2)
CO2: 21 mmol/L (ref 20–29)
Calcium: 9.4 mg/dL (ref 8.7–10.2)
Chloride: 104 mmol/L (ref 96–106)
Creatinine, Ser: 0.81 mg/dL (ref 0.57–1.00)
Globulin, Total: 2.2 g/dL (ref 1.5–4.5)
Glucose: 102 mg/dL — ABNORMAL HIGH (ref 70–99)
Potassium: 5 mmol/L (ref 3.5–5.2)
Sodium: 139 mmol/L (ref 134–144)
Total Protein: 7 g/dL (ref 6.0–8.5)
eGFR: 93 mL/min/{1.73_m2} (ref >59)

## 2022-04-26 LAB — LIPOPROTEIN A (LPA): Lipoprotein (a): 174.4 nmol/L — ABNORMAL HIGH (ref <75.0)

## 2022-04-26 LAB — NMR, LIPOPROFILE
Cholesterol, Total: 197 mg/dL (ref 100–199)
HDL Particle Number: 32.2 umol/L (ref >=30.5)
HDL-C: 61 mg/dL (ref >39)
LDL Particle Number: 1616 nmol/L — ABNORMAL HIGH (ref <1000)
LDL Size: 21.2 nm (ref >20.5)
LDL-C (NIH Calc): 107 mg/dL — ABNORMAL HIGH (ref 0–99)
LP-IR Score: 41 (ref <=45)
Small LDL Particle Number: 538 nmol/L — ABNORMAL HIGH (ref <=527)
Triglycerides: 167 mg/dL — ABNORMAL HIGH (ref 0–149)

## 2022-04-26 LAB — CBC
Hematocrit: 42.4 % (ref 34.0–46.6)
Hemoglobin: 14.4 g/dL (ref 11.1–15.9)
MCH: 32.6 pg (ref 26.6–33.0)
MCHC: 34 g/dL (ref 31.5–35.7)
MCV: 96 fL (ref 79–97)
Platelets: 186 10*3/uL (ref 150–450)
RBC: 4.42 x10E6/uL (ref 3.77–5.28)
RDW: 11.4 % — ABNORMAL LOW (ref 11.7–15.4)
WBC: 8.2 10*3/uL (ref 3.4–10.8)

## 2022-04-26 LAB — VITAMIN D 25 HYDROXY (VIT D DEFICIENCY, FRACTURES): Vit D, 25-Hydroxy: 26.8 ng/mL — ABNORMAL LOW (ref 30.0–100.0)

## 2022-04-27 NOTE — Telephone Encounter (Signed)
I think that is very reasonable.

## 2022-06-07 ENCOUNTER — Encounter: Payer: Self-pay | Admitting: Internal Medicine

## 2022-07-11 ENCOUNTER — Encounter: Payer: Self-pay | Admitting: Internal Medicine

## 2022-08-15 ENCOUNTER — Encounter: Payer: Self-pay | Admitting: Internal Medicine

## 2023-07-16 ENCOUNTER — Ambulatory Visit: Payer: BC Managed Care – PPO | Admitting: Medical

## 2023-07-16 VITALS — BP 106/68 | HR 67 | Temp 97.7°F | Wt 160.8 lb

## 2023-07-16 DIAGNOSIS — B001 Herpesviral vesicular dermatitis: Secondary | ICD-10-CM

## 2023-07-16 DIAGNOSIS — R300 Dysuria: Secondary | ICD-10-CM | POA: Diagnosis not present

## 2023-07-16 LAB — POCT URINALYSIS DIP (PROADVANTAGE DEVICE)
Bilirubin, UA: NEGATIVE
Blood, UA: NEGATIVE
Glucose, UA: NEGATIVE mg/dL
Ketones, POC UA: NEGATIVE mg/dL
Leukocytes, UA: NEGATIVE
Nitrite, UA: NEGATIVE
Protein Ur, POC: NEGATIVE mg/dL
Specific Gravity, Urine: 1.02
Urobilinogen, Ur: NEGATIVE
pH, UA: 5 (ref 5.0–8.0)

## 2023-07-16 MED ORDER — VALACYCLOVIR HCL 1 G PO TABS: ORAL_TABLET | ORAL | 1 refills | Status: AC

## 2023-07-16 MED ORDER — SULFAMETHOXAZOLE-TRIMETHOPRIM 800-160 MG PO TABS
1.0000 | ORAL_TABLET | Freq: Two times a day (BID) | ORAL | 0 refills | Status: AC
Start: 2023-07-16 — End: 2023-07-21

## 2023-07-16 NOTE — Addendum Note (Signed)
Addended by: Jac Canavan on: 07/16/2023 03:46 PM   Modules accepted: Orders

## 2023-07-16 NOTE — Progress Notes (Addendum)
Subjective:   Judy Shaw is a 42 y.o. female who complains of possible urinary tract infection.   Chief Complaint  Patient presents with   Urinary Tract Infection    UTI, painful with urination, cloudy, symptoms started Saturday. Had cystoscopy 15 years ago and hasn't had one since    Symptoms began 3 days ago.  Symptoms include urinary pressure, pain with urination, cloudy urine, urgency, but not a lot of frequency.  Patient denies fever, chills, body aches.  No odor, no blood.  Last UTI was probably a year ago.   Using cranberry pills, lots of water, cystex for current symptoms.    Patient does have a history of recurrent UTI.   Patient does not have a history of pyelonephritis.    They deny any recent change in soap or hygiene products.   They deny discharge or suspected genital infection.  Married, no concern for STD.   Has some insomnia, and when not sleeping, having cold sores, feels like her immune defenses are weakened.  Not having real frequent cold sores but occasional.  Is out of medicaiton for this.  No other aggravating or relieving factors.  No other c/o.  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/2019   Allergy    AMA (advanced maternal age) multigravida 35+ 02/22/2017   Anal fissure    Asthma    COVID-19    External hemorrhoid    Fetal microcephaly affecting antepartum care of mother 11/28/2016   Heart palpitations    Herpes labialis    History of prior pregnancy with IUGR newborn 11/28/2016   Hyperplastic colon polyp    Increased body mass index 04/23/2020   Medical history non-contributory    Postpartum state 02/22/2017   Social anxiety disorder 05/22/2019   UTI (urinary tract infection)    history - 7 last year in 2013 per patient   Vacuum extractor delivery, delivered 10/31/2013   Vaginal itching 05/10/2016    Current Outpatient Medications on File Prior to Visit  Medication Sig  Dispense Refill   albuterol (VENTOLIN HFA) 108 (90 Base) MCG/ACT inhaler Inhale into the lungs every 6 (six) hours as needed for wheezing or shortness of breath.     No current facility-administered medications on file prior to visit.    ROS as in subjective  Reviewed allergies, medications, past medical, surgical, and social history.     Objective: BP 106/68   Pulse 67   Temp 97.7 F (36.5 C)   Wt 160 lb 12.8 oz (72.9 kg)   BMI 28.48 kg/m   General appearance: alert, no distress, WD/WN, female Abdomen: +bs, soft, non tender, mild suprapubic tenderness otherwise non distended, no masses, no hepatomegaly, no splenomegaly, no bruits Back: no CVA tenderness GU: deferred    Assessment: Encounter Diagnoses  Name Primary?   Dysuria Yes   Cold sore      Plan: Discussed symptoms of UTI, urinalysis normal today.  Urine culture sent.   You are being treated for a urinary tract infection. Antibiotic prescribed today is bactrim x 3-5 days Hydrate well with water to help flush the infection from the body.  Drink at least 64 oz of water daily. You can use either cranberry juice or cranberry tablets the next few days to alter the pH of the urine and help clear the infection We will call with the results of the urine culture.  It usually takes 2-3 days to get the results.  I would expect you to feel much better within the next 3-4 days.  If you are not seeing improvement, or if you have worsening symptoms in the next 3-4 days, then call or return.  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  Diagnoses and all orders for this visit:  Dysuria -     POCT Urinalysis DIP (Proadvantage Device) -     Urine Culture  Cold sore  Other orders -     sulfamethoxazole-trimethoprim (BACTRIM DS) 800-160 MG tablet; Take 1 tablet by mouth 2 (two) times daily for 5 days. -     valACYclovir (VALTREX) 1000 MG tablet; 1 tablets po BID  x 2 days for flare up    Return pending culture.

## 2023-07-17 LAB — URINE CULTURE: Organism ID, Bacteria: NO GROWTH

## 2023-07-18 NOTE — Progress Notes (Signed)
Results sent through MyChart

## 2023-12-27 ENCOUNTER — Ambulatory Visit: Payer: Self-pay

## 2023-12-27 NOTE — Telephone Encounter (Signed)
 Judy Shaw has an opening this afternoon if she wants today versus tomorrow

## 2023-12-27 NOTE — Telephone Encounter (Signed)
  Chief Complaint: rectal pain and bleeding Symptoms: mild rectal bleeding with bowel movements, rectal pain Frequency: daily for the past 3 days Pertinent Negatives: Patient denies diarrhea, hx of IBS, abdominal pain, nausea, vomiting, dizziness, weight loss Disposition: [] ED /[] Urgent Care (no appt availability in office) / [x] Appointment(In office/virtual)/ []  [facility]/ [] Home Care/ [] Refused Recommended Disposition /[] [facility]/ []  Follow-up with PCP Additional Notes: Patient states she has had colonoscopies over the past 30 years because of the rectal bleeding. She states the last one was 2022 and she would like to have another one as soon as possible. She states as a child she would get frequent fissures that caused her rectal bleeding. She states she drinks fiber supplement daily, increased water intake, abstaining from alcohol. Last episode was about 2 months ago and went away on its own. Patient concerned and seems anxious, mentioning her father died of colon cancer. Patient agreeable to acute visit tomorrow, verbalizes understanding to call back for worsening symptoms.  Copied from CRM [id number]. Topic: Clinical - Red Word Triage >> Dec 27, 2023  9:16 AM Truddie Crumble wrote: Reason for EAV:[id number] called stating she is experiencing rectal bleeding and pain with bowel movement Reason for Disposition  MILD rectal bleeding (more than just a few drops or streaks)  Answer Assessment - Initial Assessment Questions 1. APPEARANCE of BLOOD: "What color is it?" "Is it passed separately, on the surface of the stool, or mixed in with the stool?"      Bright red blood, separate from stool. denies blood clots.  2. AMOUNT: "How much blood was passed?"      She states when she wiped there was blood, the end part of her stools has bright red blood and she states it was enough it drip. In total she states there was maybe a teaspoon or less of blood.  3. FREQUENCY: "How many times  has blood been passed with the stools?"      She states she was having it a couple of months ago and it went away, this time it has been a blood stool each day for 3 days.  4. ONSET: "When was the blood first seen in the stools?" (Days or weeks)      Consistently over the last 3 days.  5. DIARRHEA: "Is there also some diarrhea?" If Yes, ask: "How many diarrhea stools in the past 24 hours?"      She states her bowel movements are not consistent, she states over the past 3 days denies diarrhea.  6. CONSTIPATION: "Do you have constipation?" If Yes, ask: "How bad is it?"     Yes. She states the other day it was just pebbles.  7. RECURRENT SYMPTOMS: "Have you had blood in your stools before?" If Yes, ask: "When was the last time?" and "What happened that time?"      Yes, last episode was about 2 months ago and it resolved on its own.  8. BLOOD THINNERS: "Do you take any blood thinners?" (e.g., Coumadin/warfarin, Pradaxa/dabigatran, aspirin)     Denies.  9. OTHER SYMPTOMS: "Do you have any other symptoms?"  (e.g., abdomen pain, vomiting, dizziness, fever)     Rectal pain.  10. PREGNANCY: "Is there any chance you are pregnant?" "When was your last menstrual period?"       LMP: 12/23/23.  Protocols used: Rectal Bleeding-A-AH

## 2023-12-28 ENCOUNTER — Ambulatory Visit: Admitting: Medical

## 2023-12-28 ENCOUNTER — Telehealth: Payer: Self-pay | Admitting: Medical

## 2023-12-28 VITALS — BP 120/70 | HR 72 | Ht 62.0 in | Wt 160.4 lb

## 2023-12-28 DIAGNOSIS — K6289 Other specified diseases of anus and rectum: Secondary | ICD-10-CM

## 2023-12-28 DIAGNOSIS — Z800 Family history of malignant neoplasm of digestive organs: Secondary | ICD-10-CM

## 2023-12-28 DIAGNOSIS — Z23 Encounter for immunization: Secondary | ICD-10-CM | POA: Diagnosis not present

## 2023-12-28 DIAGNOSIS — K625 Hemorrhage of anus and rectum: Secondary | ICD-10-CM

## 2023-12-28 LAB — CBC WITH DIFFERENTIAL/PLATELET
Basophils Absolute: 0 10*3/uL (ref 0.0–0.2)
Basos: 0 %
EOS (ABSOLUTE): 0.2 10*3/uL (ref 0.0–0.4)
Eos: 3 %
Hematocrit: 41.6 % (ref 34.0–46.6)
Hemoglobin: 14 g/dL (ref 11.1–15.9)
Immature Grans (Abs): 0 10*3/uL (ref 0.0–0.1)
Immature Granulocytes: 0 %
Lymphocytes Absolute: 1.6 10*3/uL (ref 0.7–3.1)
Lymphs: 23 %
MCH: 32 pg (ref 26.6–33.0)
MCHC: 33.7 g/dL (ref 31.5–35.7)
MCV: 95 fL (ref 79–97)
Monocytes Absolute: 0.5 10*3/uL (ref 0.1–0.9)
Monocytes: 7 %
Neutrophils Absolute: 4.5 10*3/uL (ref 1.4–7.0)
Neutrophils: 67 %
Platelets: 211 10*3/uL (ref 150–450)
RBC: 4.37 x10E6/uL (ref 3.77–5.28)
RDW: 11.4 % — ABNORMAL LOW (ref 11.7–15.4)
WBC: 6.8 10*3/uL (ref 3.4–10.8)

## 2023-12-28 MED ORDER — HYDROCORTISONE 2.5 % EX CREA: TOPICAL_CREAM | Freq: Two times a day (BID) | CUTANEOUS | 0 refills | Status: AC

## 2023-12-28 MED ORDER — DILTIAZEM GEL 2 %: 1.0000 | Freq: Two times a day (BID) | CUTANEOUS | 0 refills | Status: AC

## 2023-12-28 MED ORDER — HYDROCORTISONE ACETATE 25 MG RE SUPP
25.0000 mg | Freq: Two times a day (BID) | RECTAL | 0 refills | Status: AC
Start: 2023-12-28 — End: ?

## 2023-12-28 NOTE — Telephone Encounter (Signed)
 Please call her Tuesday of next week to check up on symptoms  When she was here on Friday, [place] and called her pharmacy.  Apparently her insurance was not covering some of the medications and cash pay price was not exactly cheap.  Apparently the pharmacy did not have anything other than the one brand of suppositories  Nevertheless, I hope she has had some improvement.  See if she wants to be referred to the integrative therapies therapist that was discussing  Also Dr. Orvan Falconer apparently has moved to [place]  Is she agreeable to referral to a different gastroenterologist, if so I will place a referral

## 2023-12-28 NOTE — Progress Notes (Signed)
 Subjective:  Judy Shaw is a 43 y.o. female who presents for Chief Complaint  Patient presents with   Rectal Bleeding    Has history of rectal pain and bleeding. Acute onset about 3 days ago. GI is Dr.Beavers at [facility].      Here for rectal bleeding and pain.  She has a long history of issues with rectal pain.  Even in teenage years she had problems with pain and difficulty with bowel movements.  So she is used to problems with pain and blood in the stool.  For the past year she has had a family regular discomfort at the anus and bleeding intermittent.  In the past few days she has had a little bit more pain than usual and blood.  She notes blood dripping out into the toilet bowl when she is using the bathroom, toilet paper stained with blood, all bright red.  The pain concerns her more than the blood.  She does not always have constipation.  She does do fiber supplement daily drinks a fair amount of water.  She has had fissures prior.  She tries to work on fiber and water intake and does not rely on prescription creams as she worries about side effects of overuse of steroid cream.  She is concerned because her father died of colorectal cancer 2 years ago.  She sometimes has worse issue and fissure with heavy alcohol use.  She intentionally cut back alcohol at the start of this year 2025.  She has had hemorrhoids before but this does not like a hemorrhoid today.  No history of proctitis or perirectal abscess.  No other aggravating or relieving factors.    No other c/o.  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/2019   Allergy    AMA (advanced maternal age) multigravida 35+ 02/22/2017   Anal fissure    Asthma    COVID-19    External hemorrhoid    Fetal microcephaly affecting antepartum care of mother 11/28/2016   Heart palpitations    Herpes labialis    History of prior pregnancy with IUGR newborn  11/28/2016   Hyperplastic colon polyp    Increased body mass index 04/23/2020   Medical history non-contributory    Postpartum state 02/22/2017   Social anxiety disorder 05/22/2019   UTI (urinary tract infection)    history - 7 last year in 2013 per patient   Vacuum extractor delivery, delivered 10/31/2013   Vaginal itching 05/10/2016   Current Outpatient Medications on File Prior to Visit  Medication Sig Dispense Refill   Wheat Dextrin (BENEFIBER) POWD Take 1 Scoop by mouth daily.     albuterol (VENTOLIN HFA) 108 (90 Base) MCG/ACT inhaler Inhale into the lungs every 6 (six) hours as needed for wheezing or shortness of breath. (Patient not taking: Reported on 12/28/2023)     valACYclovir (VALTREX) 1000 MG tablet 1 tablets po BID x 2 days for flare up (Patient not taking: Reported on 12/28/2023) 30 tablet 1   No current facility-administered medications on file prior to visit.   Past Surgical History:  Procedure Laterality Date   COLONOSCOPY  06/2021   Dr. Tressia Danas, repeat 5 years   CYSTOSCOPY     WISDOM TOOTH EXTRACTION       The following portions of the patient's history were reviewed and updated as appropriate: allergies, current medications, past family history, past medical history, past social history, past surgical history and problem list.  ROS Otherwise as in subjective above    Objective: BP 120/70   Pulse 72   Ht 5\' 2"  (1.575 m)   Wt 160 lb 6.4 oz (72.8 kg)   LMP 12/23/2023 (Exact Date)   SpO2 97%   BMI 29.34 kg/m   General appearance: alert, no distress, well developed, well nourished Abdomen: +bs, soft, non tender, non distended, no masses, no hepatomegaly, no splenomegaly Rectal: tender right side of anus, but no obvious fissure or tear, no abscess or induration or fluctuance, no swollen or thrombosed hemorrhoid.  There is some small hemorrhoidal tissue Exam chaperoned by nurse     Assessment: Encounter Diagnoses  Name Primary?   Rectal bleeding  Yes   Rectal pain    Family history of colon cancer    Need for shingles vaccine      Plan: We discussed her symptoms and concerns both acute and chronic rectal pain concerns.  Recommendations: I recommend for the next 3 days using hydrocortisone Proctosol suppositories twice daily Begin diltiazem gel topically and use this for the next 1 to 2 weeks for potential fissure Try to get at least 80 to 100 ounces of water daily Get fiber in the diet daily which can include whole grains, vegetables, roughage such as greens and lettuce's, fiber supplement such as Metamucil or FiberCon You can use MiraLAX powder 1/2 - 1 dose daily or every other day to keep stool soft, not loose For any external hemorrhoid inflammation or irritation you can use hydrocortisone cream topically for 3 to 5 days at a time Use bath water soaks with hot soapy water when having discomfort for symptoms Consider referral to integrative therapies for pelvic floor/rectal specialist physical therapist to help with rectal pain Follow-up with your gastroenterologist to discuss your rectal pain further and to discuss the blood in the stool CBC today to check hemoglobin level  She has had a colonoscopy prior which was reviewed from September 2022, normal  She requested shingles vaccine as she has family history of dementia and she has read a study that showed the benefits of Shingrix vaccine in regards to slowing progression to dementia.  She wanted to begin shingles vaccine today despite general recommendations for age 44 and above for shingles Zostavax prevention.  We discussed that this may not be covered by insurance and this was off label use   Judy "Britt Boozer" was seen today for rectal bleeding.  Diagnoses and all orders for this visit:  Rectal bleeding -     CBC with Differential/Platelet  Rectal pain -     CBC with Differential/Platelet  Family history of colon cancer -     CBC with Differential/Platelet  Need  for shingles vaccine -     Zoster Recombinant (Shingrix )  Other orders -     hydrocortisone (ANUSOL-HC) 25 MG suppository; Place 1 suppository (25 mg total) rectally 2 (two) times daily. -     hydrocortisone 2.5 % cream; Apply topically 2 (two) times daily. -     diltiazem 2 % GEL; Apply 1 Application topically 2 (two) times daily.    Follow up: Pending lab

## 2023-12-30 NOTE — Progress Notes (Signed)
 Results sent through MyChart

## 2023-12-31 NOTE — Telephone Encounter (Signed)
 Spoke with patient. She is doing better. No pain or blood. She was able to get the medication at a reduce cost so she has seen improvement.   I have put in a referral to GI and intergative therapy for pelvic therapy

## 2024-02-20 ENCOUNTER — Telehealth: Payer: Self-pay | Admitting: Nurse Practitioner

## 2024-02-20 NOTE — Telephone Encounter (Signed)
 Copied from CRM [id number]. Topic: Referral - Question >> Feb 20, 2024 10:36 AM Alyse July wrote: Reason for CRM: Patient would like to know if referral can be re-faxed to [facility] patient contact office to schedule and was informed they no longer have the referral.

## 2024-03-18 NOTE — Progress Notes (Signed)
 VASHON ARCH [id number] 1981-09-14   Chief Complaint: Rectal bleeding  Referring Provider: Claudene Crystal, PA-C Primary GI MD: Para Bold (previous Dr. Savannah Curlin)  HPI: Judy Shaw is a 43 y.o. female with past medical history of external hemorrhoid, anal fissure, ADHD, hyperplastic colon polyp, who presents today for a complaint of rectal bleeding and to discuss colonoscopy.    04/08/2021 patient last seen in office by Dr. Savannah Curlin for family history of colon cancer and colon polyps, constipation, and anal fissures.  At that time patient reported a near lifetime history of rectal bleeding dating back to childhood, with difficulties with both hemorrhoids and fissures.  Had some alternating diarrhea and constipation but no abdominal pain.  Used MiraLAX daily, some improvement using daily Metamucil.  Patient reported having a colonoscopy in 2005 in [place], [place] , and reportedly had benign polyps found.  Patient's father was diagnosed with stage IV colon cancer in his 48s. He had never had a colonoscopy until his diagnosis. Patient's brother has colon polyps and fissures as well.  Unable to obtain past records at last visit.  12/28/2023 patient seen by PCP for rectal bleeding and pain.  Ongoing history as previously stated, though reported a little bit more pain than usual and blood.  Noted to have right-sided anal tenderness on exam but no obvious fissure.  Some small hemorrhoidal tissue.  She was prescribed hydrocortisone  Proctosol suppositories twice daily for 3 days and diltiazem  gel topically for the next 1 to 2 weeks for potential fissure.  CBC 12/28/2023: Normal    Patient states she had onset of rectal pain and bleeding after passing a large, hard stool.  She had recently noted a change in her diet, was consuming a lot of popcorn at the time and thinks this may have led to a harder stool.  Pain felt different than what she had experienced in the past with hemorrhoids.   Bowel movements were painful, and pain could persist throughout the day.  It was relieved by Motrin .  Pain could radiate to her buttocks and was not relieved with change in position.  This lasted for several days but did improve with topical treatments that she was prescribed as well as making changes in her diet to increase fiber, and taking MiraLAX as needed.  She is doing better now.  No longer has any rectal pain, and is having a soft bowel movement daily on fiber supplements and occasional MiraLAX.  She has been to pelvic floor physical therapy though does not feel like this is providing much benefit for her.  She denies any abdominal pain.   Previous GI Procedures/Imaging   Colonoscopy 06/03/2021  - The entire examined colon is normal. Biopsied.  - The examination was otherwise normal on direct and retroflexion views. Path: Surgical [P], colon nos, random sites - COLONIC MUCOSA WITH NO SPECIFIC HISTOPATHOLOGIC CHANGES - NEGATIVE FOR ACUTE INFLAMMATION, INCREASED INTRAEPITHELIAL LYMPHOCYTES OR THICKENED SUBEPITHELIAL COLLAGEN TABLE  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/2019   Allergy    AMA (advanced maternal age) multigravida 35+ 02/22/2017   Anal fissure    Asthma    COVID-19    External hemorrhoid    Fetal microcephaly affecting antepartum care of mother 11/28/2016   Heart palpitations    Herpes labialis    History of prior pregnancy with IUGR newborn 11/28/2016   Hyperplastic colon polyp    Increased body mass index 04/23/2020   Medical history non-contributory  Postpartum state 02/22/2017   Social anxiety disorder 05/22/2019   UTI (urinary tract infection)    history - 7 last year in 2013 per patient   Vacuum extractor delivery, delivered 10/31/2013   Vaginal itching 05/10/2016    Past Surgical History:  Procedure Laterality Date   COLONOSCOPY  06/2021   Dr. Lindle Rhea, repeat 5 years    CYSTOSCOPY     WISDOM TOOTH EXTRACTION      Current Outpatient Medications  Medication Sig Dispense Refill   albuterol  (VENTOLIN  HFA) 108 (90 Base) MCG/ACT inhaler Inhale into the lungs every 6 (six) hours as needed for wheezing or shortness of breath.     diltiazem  2 % GEL Apply 1 Application topically 2 (two) times daily. 20 g 0   hydrocortisone  (ANUSOL -HC) 25 MG suppository Place 1 suppository (25 mg total) rectally 2 (two) times daily. 12 suppository 0   hydrocortisone  2.5 % cream Apply topically 2 (two) times daily. 30 g 0   valACYclovir  (VALTREX ) 1000 MG tablet 1 tablets po BID x 2 days for flare up 30 tablet 1   Wheat Dextrin (BENEFIBER) POWD Take 1 Scoop by mouth daily.     No current facility-administered medications for this visit.    Allergies as of 03/19/2024 - Review Complete 03/19/2024  Allergen Reaction Noted   Macrobid [nitrofurantoin macrocrystal] Anaphylaxis 10/06/2012    Family History  Problem Relation Age of Onset   Diverticulitis Mother    Alzheimer's disease Mother 38   Cancer Father    Colon cancer Father 60       colorectal   Heart Problems Father        palpitations   Diabetes Father 63   Heart Problems Brother        palpitations   Hypertension Brother    Colon polyps Brother    Anal fissures Brother    Alzheimer's disease Maternal Grandfather    CVA Paternal Grandfather    Hypertension Other    Esophageal cancer Neg Hx    Stomach cancer Neg Hx    Hyperlipidemia Neg Hx    Heart attack Neg Hx    Breast cancer Neg Hx     Social History   Tobacco Use   Smoking status: Former    Current packs/day: 0.00    Types: Cigarettes    Start date: 10/02/1993    Quit date: 10/03/2003    Years since quitting: 20.4   Smokeless tobacco: Never  Vaping Use   Vaping status: Never Used  Substance Use Topics   Alcohol use: Yes    Comment: 2 per day, up to 20 in a week, mix of things   Drug use: No     Review of Systems:    Constitutional: No weight  loss, fever, chills, weakness or fatigue Skin: No rash or itching Cardiovascular: No chest pain, chest pressure or palpitations   Respiratory: No SOB or cough Gastrointestinal: See HPI and otherwise negative Neurological: No headache, dizziness or syncope Hematologic: No bruising    Physical Exam:  Vital signs: BP 120/60   Ht 5' 2 (1.575 m)   Wt 162 lb 3.2 oz (73.6 kg)   BMI 29.67 kg/m   Constitutional: NAD, Well developed, Well nourished, alert and cooperative Head:  Normocephalic and atraumatic.  Eyes: No scleral icterus. Conjunctiva pink. Mouth: No oral lesions. Respiratory: Respirations even and unlabored. Lungs clear to auscultation bilaterally.  No wheezes, crackles, or rhonchi.  Cardiovascular:  Regular rate and rhythm. No murmurs. No peripheral  edema. Gastrointestinal:  Soft, nondistended, nontender. No rebound or guarding. Normal bowel sounds. No appreciable masses or hepatomegaly. Rectal:  Possible healing fissure at anterior midline which is nonbleeding and not tender to palpation. No hemorrhoids seen. Chaperone present for exam. Neurologic:  Alert and oriented x4;  grossly normal neurologically.  Skin:   Dry and intact without significant lesions or rashes. Psychiatric: Oriented to person, place and time. Demonstrates good judgement and reason without abnormal affect or behaviors.   RELEVANT LABS AND IMAGING: CBC    Component Value Date/Time   WBC 6.8 12/28/2023 1012   WBC 6.7 05/11/2017 0914   RBC 4.37 12/28/2023 1012   RBC 4.27 05/11/2017 0914   HGB 14.0 12/28/2023 1012   HCT 41.6 12/28/2023 1012   PLT 211 12/28/2023 1012   MCV 95 12/28/2023 1012   MCH 32.0 12/28/2023 1012   MCH 31.9 05/11/2017 0914   MCHC 33.7 12/28/2023 1012   MCHC 33.9 05/11/2017 0914   RDW 11.4 (L) 12/28/2023 1012   LYMPHSABS 1.6 12/28/2023 1012   MONOABS 469 05/11/2017 0914   EOSABS 0.2 12/28/2023 1012   BASOSABS 0.0 12/28/2023 1012    CMP     Component Value Date/Time   NA  139 04/25/2022 0925   K 5.0 04/25/2022 0925   CL 104 04/25/2022 0925   CO2 21 04/25/2022 0925   GLUCOSE 102 (H) 04/25/2022 0925   GLUCOSE 88 05/11/2017 0914   BUN 9 04/25/2022 0925   CREATININE 0.81 04/25/2022 0925   CREATININE 0.73 05/11/2017 0914   CALCIUM 9.4 04/25/2022 0925   PROT 7.0 04/25/2022 0925   ALBUMIN 4.8 04/25/2022 0925   AST 12 04/25/2022 0925   ALT 13 04/25/2022 0925   ALKPHOS 85 04/25/2022 0925   BILITOT 0.6 04/25/2022 0925   GFRNONAA 99 05/15/2019 0910   GFRAA 114 05/15/2019 0910   Echocardiogram 02/13/2022 1. Left ventricular ejection fraction, by estimation, is 60 to 65% . The left ventricle has normal function. The left ventricle has no regional wall motion abnormalities. Left ventricular diastolic parameters were normal.  2. Right ventricular systolic function is normal. The right ventricular size is normal. Tricuspid regurgitation signal is inadequate for assessing PA pressure.  3. The mitral valve is grossly normal. Trivial mitral valve regurgitation. No evidence of mitral stenosis.  4. The aortic valve is tricuspid. Aortic valve regurgitation is not visualized. No aortic stenosis is present.  5. The inferior vena cava is normal in size with greater than 50% respiratory variability, suggesting right atrial pressure of 3 mmHg.  Assessment/Plan:   Family history of colon cancer - father Rectal pain and bleeding Constipation Patient with long history of intermittent rectal pain and bleeding due to hemorrhoids and fissures.  Had initial colonoscopy when she was 24 for this reason, and last colonoscopy 2022 was normal with no polyps. Due for recall in 2027 based on family history. A few months ago had onset of rectal pain and bleeding after passing a large, hard stool.  Pain and bleeding lasted for several days, did not feel similar to hemorrhoids.  She was evaluated by PCP with no obvious hemorrhoids or fissures seen, but her symptoms did improve with topical  therapies, dietary changes, fiber supplement, and MiraLAX. Possible healing fissure at anterior midline seen on exam today.  - If symptoms recur/persist despite treatment, consider flexible sigmoidoscopy vs early repeat colonoscopy - Use topical diltiazem  if recurrence of symptoms - Continue high-fiber diet with addition of fiber supplement - Continue MiraLAX as needed.  Can increase to daily use if having hard stools and straining.    Valiant Gaul, PA-C [facility] 03/19/2024, 11:13 AM  Patient Care Team: Early, Adriane Albe, NP as PCP - General (Nurse Practitioner) Luana Rumple, MD as PCP - Cardiology (Cardiology)

## 2024-03-19 ENCOUNTER — Encounter: Payer: Self-pay | Admitting: Gastroenterology

## 2024-03-19 ENCOUNTER — Ambulatory Visit: Payer: Self-pay | Admitting: Gastroenterology

## 2024-03-19 VITALS — BP 120/60 | Ht 62.0 in | Wt 162.2 lb

## 2024-03-19 DIAGNOSIS — K625 Hemorrhage of anus and rectum: Secondary | ICD-10-CM

## 2024-03-19 DIAGNOSIS — K6289 Other specified diseases of anus and rectum: Secondary | ICD-10-CM

## 2024-03-19 DIAGNOSIS — Z800 Family history of malignant neoplasm of digestive organs: Secondary | ICD-10-CM | POA: Diagnosis not present

## 2024-03-19 DIAGNOSIS — K5900 Constipation, unspecified: Secondary | ICD-10-CM

## 2024-03-19 NOTE — Patient Instructions (Signed)
 Follow-up as needed.   _______________________________________________________  If your blood pressure at your visit was 140/90 or greater, please contact your primary care physician to follow up on this.  _______________________________________________________  If you are age 44 or older, your body mass index should be between 23-30. Your Body mass index is 29.67 kg/m. If this is out of the aforementioned range listed, please consider follow up with your Primary Care Provider.  If you are age 30 or younger, your body mass index should be between 19-25. Your Body mass index is 29.67 kg/m. If this is out of the aformentioned range listed, please consider follow up with your Primary Care Provider.   ________________________________________________________  The [facility] providers would like to encourage you to use MYCHART to communicate with providers for non-urgent requests or questions.  Due to long hold times on the telephone, sending your provider a message by [facility] may be a faster and more efficient way to get a response.  Please allow 48 business hours for a response.  Please remember that this is for non-urgent requests.  _______________________________________________________  Thank you for choosing me and [facility].

## 2024-03-21 NOTE — Progress Notes (Signed)
 ____________________________________________________________  Attending physician addendum:  Thank you for sending this case to me. I have reviewed the entire note and agree with the plan.  Goal here appears to be trying to maintain BM regularity since she is clearly prone to develop fissures.  Lorella Roles, MD  ____________________________________________________________

## 2024-07-17 ENCOUNTER — Other Ambulatory Visit: Payer: Self-pay | Admitting: Obstetrics and Gynecology

## 2024-07-17 DIAGNOSIS — R928 Other abnormal and inconclusive findings on diagnostic imaging of breast: Secondary | ICD-10-CM

## 2024-07-28 ENCOUNTER — Ambulatory Visit
Admission: RE | Admit: 2024-07-28 | Discharge: 2024-07-28 | Disposition: A | Source: Ambulatory Visit | Attending: Obstetrics and Gynecology | Admitting: Obstetrics and Gynecology

## 2024-07-28 ENCOUNTER — Other Ambulatory Visit: Payer: Self-pay | Admitting: Obstetrics and Gynecology

## 2024-07-28 DIAGNOSIS — R928 Other abnormal and inconclusive findings on diagnostic imaging of breast: Secondary | ICD-10-CM

## 2024-07-28 DIAGNOSIS — N6320 Unspecified lump in the left breast, unspecified quadrant: Secondary | ICD-10-CM

## 2024-07-29 ENCOUNTER — Ambulatory Visit
Admission: RE | Admit: 2024-07-29 | Discharge: 2024-07-29 | Disposition: A | Discharge status: Home / Self Care | Source: Ambulatory Visit | Attending: Obstetrics and Gynecology | Admitting: Obstetrics and Gynecology

## 2024-07-29 DIAGNOSIS — N6320 Unspecified lump in the left breast, unspecified quadrant: Secondary | ICD-10-CM

## 2024-07-29 DIAGNOSIS — R928 Other abnormal and inconclusive findings on diagnostic imaging of breast: Secondary | ICD-10-CM

## 2024-07-29 HISTORY — PX: BREAST BIOPSY: SHX20

## 2024-07-30 LAB — SURGICAL PATHOLOGY
